# Patient Record
Sex: Male | Born: 1940
Health system: Southern US, Community
[De-identification: ages and names within clinical notes are randomized; demographics above are authoritative.]

## PROBLEM LIST (undated history)

## (undated) DIAGNOSIS — E119 Type 2 diabetes mellitus without complications: Secondary | ICD-10-CM

## (undated) DIAGNOSIS — I480 Paroxysmal atrial fibrillation: Secondary | ICD-10-CM

## (undated) DIAGNOSIS — I10 Essential (primary) hypertension: Secondary | ICD-10-CM

## (undated) DIAGNOSIS — E785 Hyperlipidemia, unspecified: Secondary | ICD-10-CM

## (undated) HISTORY — PX: BACK SURGERY: SHX140

## (undated) HISTORY — PX: RETINAL DETACHMENT SURGERY: SHX105

## (undated) HISTORY — PX: CATARACT EXTRACTION: SUR2

## (undated) HISTORY — DX: Morbid (severe) obesity due to excess calories: E66.01

## (undated) HISTORY — PX: CHOLECYSTECTOMY OPEN: SUR202

## (undated) HISTORY — PX: CYST REMOVAL TRUNK: SHX6283

## (undated) HISTORY — PX: OTHER SURGICAL HISTORY: SHX169

## (undated) HISTORY — DX: Type 2 diabetes mellitus without complications: E11.9

## (undated) HISTORY — DX: Essential (primary) hypertension: I10

## (undated) HISTORY — DX: Hyperlipidemia, unspecified: E78.5

## (undated) HISTORY — DX: Paroxysmal atrial fibrillation: I48.0

---

## 1999-01-29 ENCOUNTER — Encounter: Payer: Self-pay | Admitting: Ophthalmology

## 1999-01-29 ENCOUNTER — Ambulatory Visit (HOSPITAL_COMMUNITY): Admission: RE | Admit: 1999-01-29 | Discharge: 1999-01-30 | Payer: Self-pay | Admitting: Ophthalmology

## 1999-01-30 ENCOUNTER — Ambulatory Visit (HOSPITAL_COMMUNITY): Admission: RE | Admit: 1999-01-30 | Discharge: 1999-01-30 | Payer: Self-pay | Admitting: Ophthalmology

## 2003-12-26 ENCOUNTER — Ambulatory Visit (HOSPITAL_COMMUNITY): Admission: RE | Admit: 2003-12-26 | Discharge: 2003-12-27 | Payer: Self-pay | Admitting: Neurosurgery

## 2005-10-14 IMAGING — CR DG CHEST 2V
2 series · 2 of 2 positions shown · non-contrast
Comparison: none

CLINICAL DATA: Preop for surgery on lumbar spine for spondylosis.
 CHEST TWO VIEWS:
 Two views of the chest show the lungs to be clear but slightly hyperaerated.  The heart is within the upper limits of normal.  No acute bony abnormality is seen.

[view not recorded (1 of 2)]
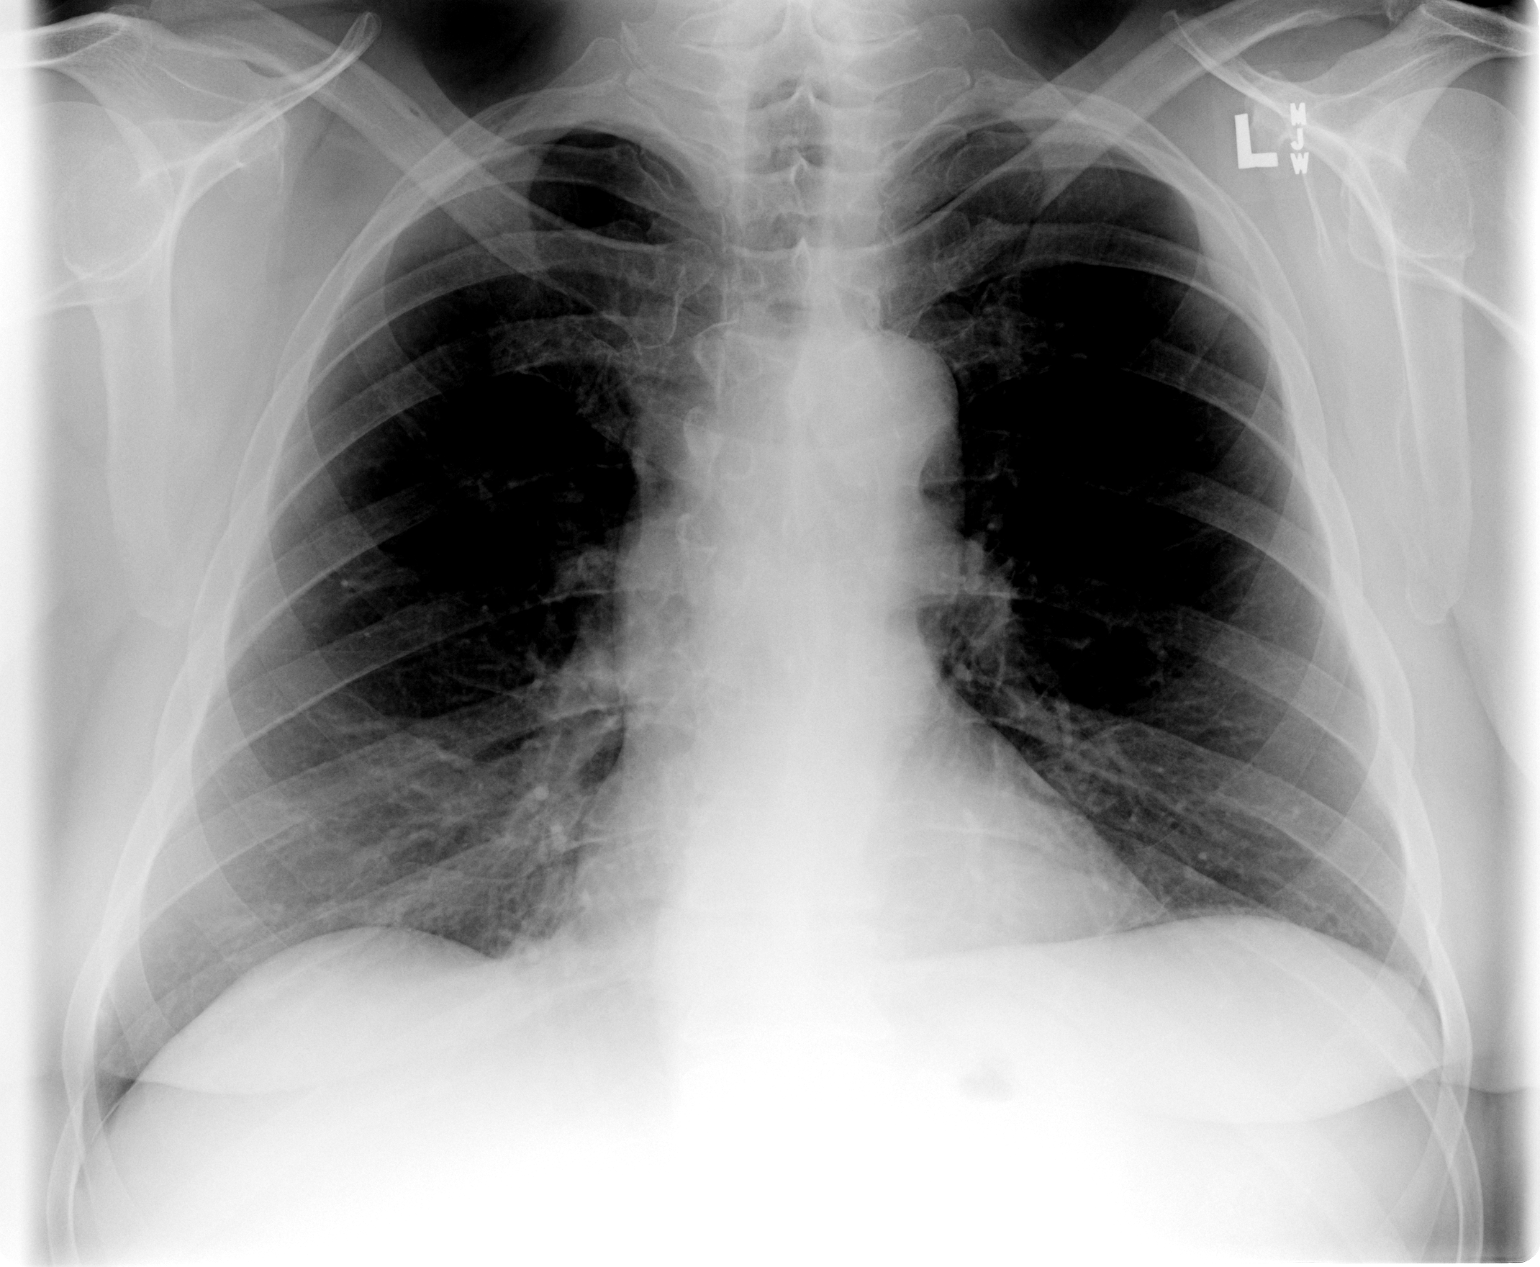

[view not recorded (2 of 2)]
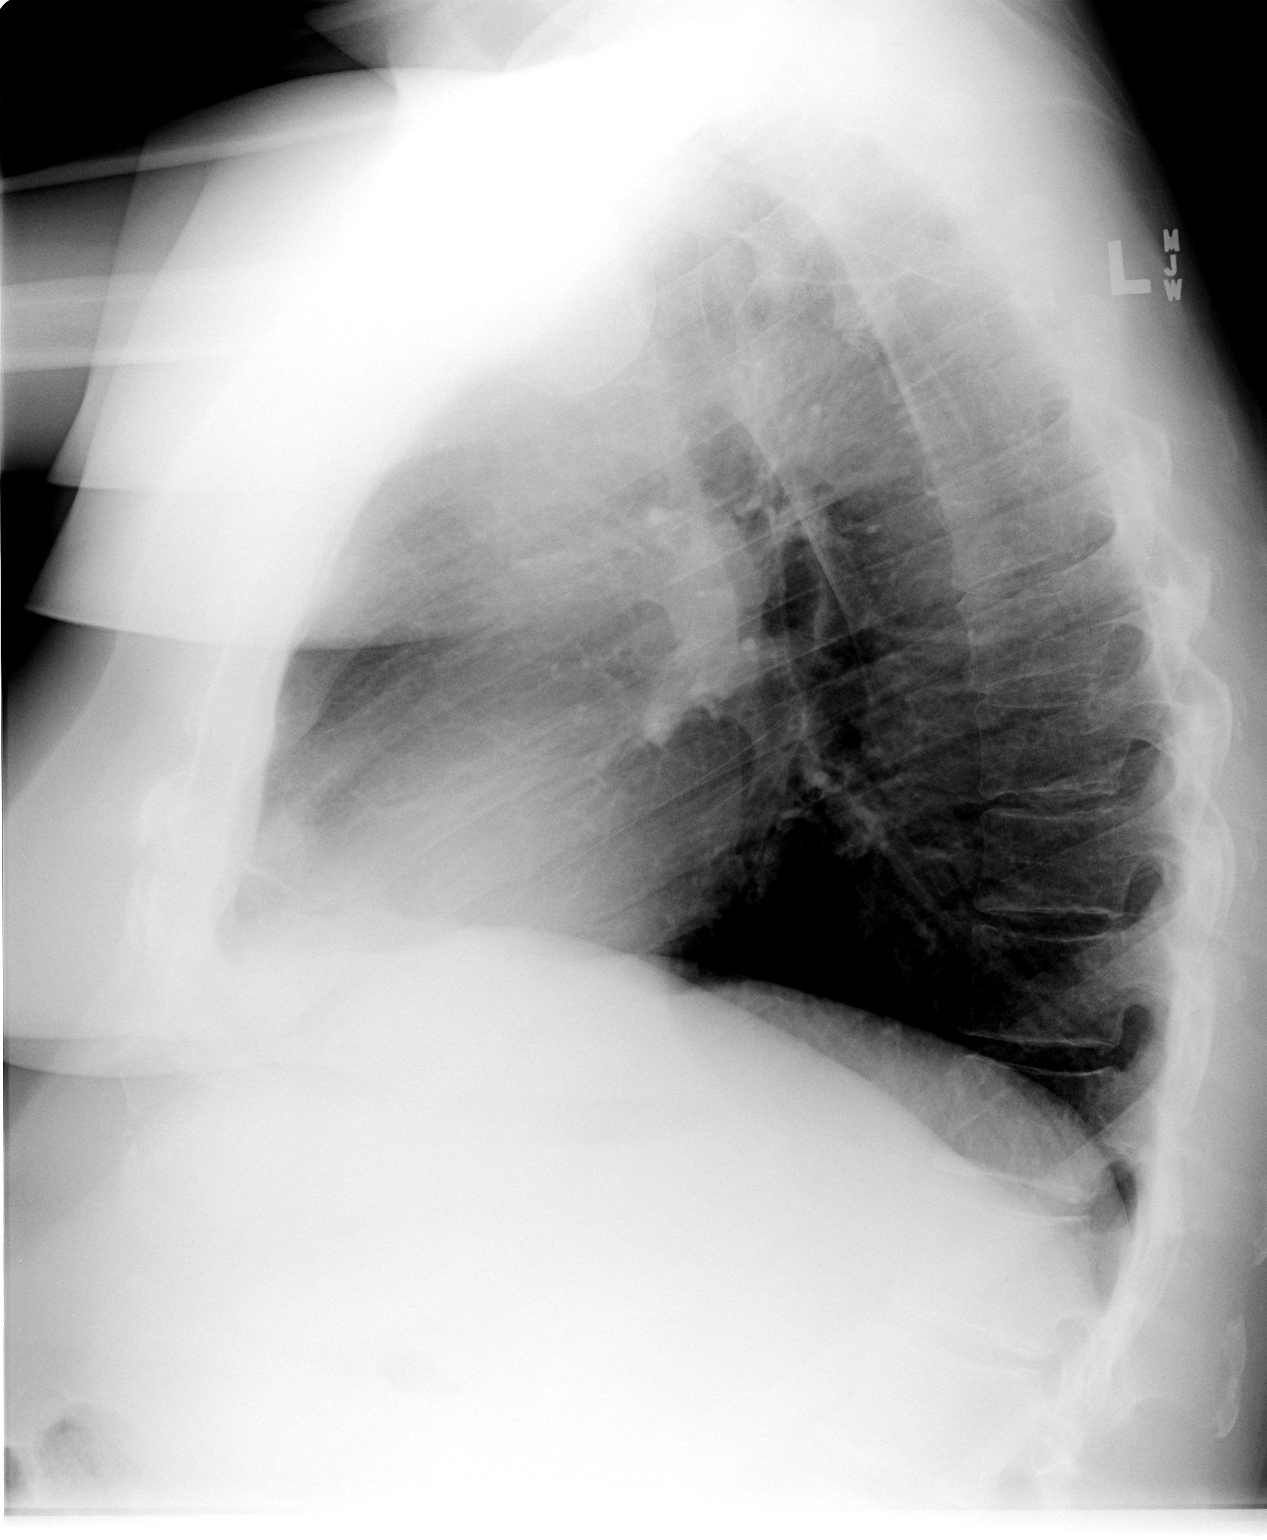

[2 of 2 positions shown; findings below may reference images not displayed]

IMPRESSION: No active lung disease.

## 2015-04-03 DIAGNOSIS — K219 Gastro-esophageal reflux disease without esophagitis: Secondary | ICD-10-CM | POA: Diagnosis not present

## 2015-04-03 DIAGNOSIS — K2270 Barrett's esophagus without dysplasia: Secondary | ICD-10-CM | POA: Diagnosis not present

## 2015-07-03 DIAGNOSIS — G8929 Other chronic pain: Secondary | ICD-10-CM | POA: Diagnosis not present

## 2015-07-03 DIAGNOSIS — I10 Essential (primary) hypertension: Secondary | ICD-10-CM | POA: Diagnosis not present

## 2015-07-03 DIAGNOSIS — M25562 Pain in left knee: Secondary | ICD-10-CM | POA: Diagnosis not present

## 2015-08-01 DIAGNOSIS — E119 Type 2 diabetes mellitus without complications: Secondary | ICD-10-CM | POA: Diagnosis not present

## 2015-08-01 DIAGNOSIS — I10 Essential (primary) hypertension: Secondary | ICD-10-CM | POA: Diagnosis not present

## 2015-08-01 DIAGNOSIS — R5383 Other fatigue: Secondary | ICD-10-CM | POA: Diagnosis not present

## 2015-08-01 DIAGNOSIS — E785 Hyperlipidemia, unspecified: Secondary | ICD-10-CM | POA: Diagnosis not present

## 2015-08-01 DIAGNOSIS — Z79899 Other long term (current) drug therapy: Secondary | ICD-10-CM | POA: Diagnosis not present

## 2015-08-01 DIAGNOSIS — E669 Obesity, unspecified: Secondary | ICD-10-CM | POA: Diagnosis not present

## 2015-11-16 DIAGNOSIS — Z23 Encounter for immunization: Secondary | ICD-10-CM | POA: Diagnosis not present

## 2015-11-21 DIAGNOSIS — I10 Essential (primary) hypertension: Secondary | ICD-10-CM | POA: Diagnosis not present

## 2015-11-21 DIAGNOSIS — Z9181 History of falling: Secondary | ICD-10-CM | POA: Diagnosis not present

## 2015-11-21 DIAGNOSIS — D509 Iron deficiency anemia, unspecified: Secondary | ICD-10-CM | POA: Diagnosis not present

## 2015-11-21 DIAGNOSIS — Z0000 Encounter for general adult medical examination without abnormal findings: Secondary | ICD-10-CM | POA: Diagnosis not present

## 2015-11-21 DIAGNOSIS — Z1389 Encounter for screening for other disorder: Secondary | ICD-10-CM | POA: Diagnosis not present

## 2015-11-21 DIAGNOSIS — Z79899 Other long term (current) drug therapy: Secondary | ICD-10-CM | POA: Diagnosis not present

## 2015-11-21 DIAGNOSIS — E669 Obesity, unspecified: Secondary | ICD-10-CM | POA: Diagnosis not present

## 2015-11-21 DIAGNOSIS — E785 Hyperlipidemia, unspecified: Secondary | ICD-10-CM | POA: Diagnosis not present

## 2015-11-21 DIAGNOSIS — E119 Type 2 diabetes mellitus without complications: Secondary | ICD-10-CM | POA: Diagnosis not present

## 2015-12-06 DIAGNOSIS — J329 Chronic sinusitis, unspecified: Secondary | ICD-10-CM | POA: Diagnosis not present

## 2015-12-06 DIAGNOSIS — J40 Bronchitis, not specified as acute or chronic: Secondary | ICD-10-CM | POA: Diagnosis not present

## 2016-04-08 DIAGNOSIS — K219 Gastro-esophageal reflux disease without esophagitis: Secondary | ICD-10-CM | POA: Diagnosis not present

## 2016-04-08 DIAGNOSIS — K2270 Barrett's esophagus without dysplasia: Secondary | ICD-10-CM | POA: Diagnosis not present

## 2016-06-17 DIAGNOSIS — L578 Other skin changes due to chronic exposure to nonionizing radiation: Secondary | ICD-10-CM | POA: Diagnosis not present

## 2016-06-17 DIAGNOSIS — L821 Other seborrheic keratosis: Secondary | ICD-10-CM | POA: Diagnosis not present

## 2016-06-17 DIAGNOSIS — L219 Seborrheic dermatitis, unspecified: Secondary | ICD-10-CM | POA: Diagnosis not present

## 2016-06-17 DIAGNOSIS — C44311 Basal cell carcinoma of skin of nose: Secondary | ICD-10-CM | POA: Diagnosis not present

## 2016-07-25 DIAGNOSIS — E1165 Type 2 diabetes mellitus with hyperglycemia: Secondary | ICD-10-CM | POA: Diagnosis not present

## 2016-07-25 DIAGNOSIS — Z6834 Body mass index (BMI) 34.0-34.9, adult: Secondary | ICD-10-CM | POA: Diagnosis not present

## 2016-07-25 DIAGNOSIS — I10 Essential (primary) hypertension: Secondary | ICD-10-CM | POA: Diagnosis not present

## 2016-07-25 DIAGNOSIS — Z7984 Long term (current) use of oral hypoglycemic drugs: Secondary | ICD-10-CM | POA: Diagnosis not present

## 2016-07-25 DIAGNOSIS — E785 Hyperlipidemia, unspecified: Secondary | ICD-10-CM | POA: Diagnosis not present

## 2016-07-25 DIAGNOSIS — Z79899 Other long term (current) drug therapy: Secondary | ICD-10-CM | POA: Diagnosis not present

## 2016-07-25 DIAGNOSIS — E669 Obesity, unspecified: Secondary | ICD-10-CM | POA: Diagnosis not present

## 2016-08-28 DIAGNOSIS — G8929 Other chronic pain: Secondary | ICD-10-CM | POA: Diagnosis not present

## 2016-08-28 DIAGNOSIS — Z6834 Body mass index (BMI) 34.0-34.9, adult: Secondary | ICD-10-CM | POA: Diagnosis not present

## 2016-08-28 DIAGNOSIS — E1165 Type 2 diabetes mellitus with hyperglycemia: Secondary | ICD-10-CM | POA: Diagnosis not present

## 2016-08-28 DIAGNOSIS — M25562 Pain in left knee: Secondary | ICD-10-CM | POA: Diagnosis not present

## 2016-09-06 DIAGNOSIS — Z6833 Body mass index (BMI) 33.0-33.9, adult: Secondary | ICD-10-CM | POA: Diagnosis not present

## 2016-09-06 DIAGNOSIS — S335XXA Sprain of ligaments of lumbar spine, initial encounter: Secondary | ICD-10-CM | POA: Diagnosis not present

## 2016-11-22 DIAGNOSIS — Z23 Encounter for immunization: Secondary | ICD-10-CM | POA: Diagnosis not present

## 2017-01-22 DIAGNOSIS — Z6833 Body mass index (BMI) 33.0-33.9, adult: Secondary | ICD-10-CM | POA: Diagnosis not present

## 2017-01-22 DIAGNOSIS — J329 Chronic sinusitis, unspecified: Secondary | ICD-10-CM | POA: Diagnosis not present

## 2017-04-17 DIAGNOSIS — Z7984 Long term (current) use of oral hypoglycemic drugs: Secondary | ICD-10-CM | POA: Diagnosis not present

## 2017-04-17 DIAGNOSIS — Z1331 Encounter for screening for depression: Secondary | ICD-10-CM | POA: Diagnosis not present

## 2017-04-17 DIAGNOSIS — E669 Obesity, unspecified: Secondary | ICD-10-CM | POA: Diagnosis not present

## 2017-04-17 DIAGNOSIS — Z6834 Body mass index (BMI) 34.0-34.9, adult: Secondary | ICD-10-CM | POA: Diagnosis not present

## 2017-04-17 DIAGNOSIS — M25562 Pain in left knee: Secondary | ICD-10-CM | POA: Diagnosis not present

## 2017-04-17 DIAGNOSIS — M1712 Unilateral primary osteoarthritis, left knee: Secondary | ICD-10-CM | POA: Diagnosis not present

## 2017-04-17 DIAGNOSIS — G8929 Other chronic pain: Secondary | ICD-10-CM | POA: Diagnosis not present

## 2017-04-17 DIAGNOSIS — Z9181 History of falling: Secondary | ICD-10-CM | POA: Diagnosis not present

## 2017-04-17 DIAGNOSIS — E1165 Type 2 diabetes mellitus with hyperglycemia: Secondary | ICD-10-CM | POA: Diagnosis not present

## 2017-06-27 DIAGNOSIS — Z6833 Body mass index (BMI) 33.0-33.9, adult: Secondary | ICD-10-CM | POA: Diagnosis not present

## 2017-06-27 DIAGNOSIS — J329 Chronic sinusitis, unspecified: Secondary | ICD-10-CM | POA: Diagnosis not present

## 2017-07-28 DIAGNOSIS — K2270 Barrett's esophagus without dysplasia: Secondary | ICD-10-CM | POA: Diagnosis not present

## 2017-07-28 DIAGNOSIS — K219 Gastro-esophageal reflux disease without esophagitis: Secondary | ICD-10-CM | POA: Diagnosis not present

## 2017-08-07 DIAGNOSIS — Z79899 Other long term (current) drug therapy: Secondary | ICD-10-CM | POA: Diagnosis not present

## 2017-08-07 DIAGNOSIS — K2270 Barrett's esophagus without dysplasia: Secondary | ICD-10-CM | POA: Diagnosis not present

## 2017-08-07 DIAGNOSIS — K449 Diaphragmatic hernia without obstruction or gangrene: Secondary | ICD-10-CM | POA: Diagnosis not present

## 2017-08-07 DIAGNOSIS — K319 Disease of stomach and duodenum, unspecified: Secondary | ICD-10-CM | POA: Diagnosis not present

## 2017-08-07 DIAGNOSIS — K229 Disease of esophagus, unspecified: Secondary | ICD-10-CM | POA: Diagnosis not present

## 2017-08-07 DIAGNOSIS — K210 Gastro-esophageal reflux disease with esophagitis: Secondary | ICD-10-CM | POA: Diagnosis not present

## 2017-08-07 DIAGNOSIS — K2950 Unspecified chronic gastritis without bleeding: Secondary | ICD-10-CM | POA: Diagnosis not present

## 2017-08-07 DIAGNOSIS — Z7984 Long term (current) use of oral hypoglycemic drugs: Secondary | ICD-10-CM | POA: Diagnosis not present

## 2017-08-07 DIAGNOSIS — M1990 Unspecified osteoarthritis, unspecified site: Secondary | ICD-10-CM | POA: Diagnosis not present

## 2017-08-07 DIAGNOSIS — Z7982 Long term (current) use of aspirin: Secondary | ICD-10-CM | POA: Diagnosis not present

## 2017-08-07 DIAGNOSIS — I10 Essential (primary) hypertension: Secondary | ICD-10-CM | POA: Diagnosis not present

## 2017-08-07 DIAGNOSIS — K219 Gastro-esophageal reflux disease without esophagitis: Secondary | ICD-10-CM | POA: Diagnosis not present

## 2017-08-07 DIAGNOSIS — E119 Type 2 diabetes mellitus without complications: Secondary | ICD-10-CM | POA: Diagnosis not present

## 2017-08-07 DIAGNOSIS — E7800 Pure hypercholesterolemia, unspecified: Secondary | ICD-10-CM | POA: Diagnosis not present

## 2017-08-07 DIAGNOSIS — Z8719 Personal history of other diseases of the digestive system: Secondary | ICD-10-CM | POA: Diagnosis not present

## 2017-09-17 DIAGNOSIS — I10 Essential (primary) hypertension: Secondary | ICD-10-CM | POA: Diagnosis not present

## 2017-09-17 DIAGNOSIS — E785 Hyperlipidemia, unspecified: Secondary | ICD-10-CM | POA: Diagnosis not present

## 2017-10-29 DIAGNOSIS — Z23 Encounter for immunization: Secondary | ICD-10-CM | POA: Diagnosis not present

## 2017-11-14 DIAGNOSIS — E669 Obesity, unspecified: Secondary | ICD-10-CM | POA: Diagnosis not present

## 2017-11-14 DIAGNOSIS — M1712 Unilateral primary osteoarthritis, left knee: Secondary | ICD-10-CM | POA: Diagnosis not present

## 2017-11-14 DIAGNOSIS — E1165 Type 2 diabetes mellitus with hyperglycemia: Secondary | ICD-10-CM | POA: Diagnosis not present

## 2017-11-14 DIAGNOSIS — Z1339 Encounter for screening examination for other mental health and behavioral disorders: Secondary | ICD-10-CM | POA: Diagnosis not present

## 2017-11-14 DIAGNOSIS — I10 Essential (primary) hypertension: Secondary | ICD-10-CM | POA: Diagnosis not present

## 2017-11-14 DIAGNOSIS — G8929 Other chronic pain: Secondary | ICD-10-CM | POA: Diagnosis not present

## 2017-11-14 DIAGNOSIS — M62838 Other muscle spasm: Secondary | ICD-10-CM | POA: Diagnosis not present

## 2017-11-14 DIAGNOSIS — Z6834 Body mass index (BMI) 34.0-34.9, adult: Secondary | ICD-10-CM | POA: Diagnosis not present

## 2017-11-14 DIAGNOSIS — M25562 Pain in left knee: Secondary | ICD-10-CM | POA: Diagnosis not present

## 2018-05-20 DIAGNOSIS — F411 Generalized anxiety disorder: Secondary | ICD-10-CM | POA: Diagnosis not present

## 2018-05-20 DIAGNOSIS — M1712 Unilateral primary osteoarthritis, left knee: Secondary | ICD-10-CM | POA: Diagnosis not present

## 2018-05-20 DIAGNOSIS — H353131 Nonexudative age-related macular degeneration, bilateral, early dry stage: Secondary | ICD-10-CM | POA: Diagnosis not present

## 2018-05-20 DIAGNOSIS — M25562 Pain in left knee: Secondary | ICD-10-CM | POA: Diagnosis not present

## 2018-05-20 DIAGNOSIS — E119 Type 2 diabetes mellitus without complications: Secondary | ICD-10-CM | POA: Diagnosis not present

## 2018-05-20 DIAGNOSIS — G8929 Other chronic pain: Secondary | ICD-10-CM | POA: Diagnosis not present

## 2018-05-20 DIAGNOSIS — H02831 Dermatochalasis of right upper eyelid: Secondary | ICD-10-CM | POA: Diagnosis not present

## 2018-05-20 DIAGNOSIS — Z961 Presence of intraocular lens: Secondary | ICD-10-CM | POA: Diagnosis not present

## 2018-05-20 DIAGNOSIS — H02834 Dermatochalasis of left upper eyelid: Secondary | ICD-10-CM | POA: Diagnosis not present

## 2018-05-20 DIAGNOSIS — E669 Obesity, unspecified: Secondary | ICD-10-CM | POA: Diagnosis not present

## 2018-05-20 DIAGNOSIS — Z6833 Body mass index (BMI) 33.0-33.9, adult: Secondary | ICD-10-CM | POA: Diagnosis not present

## 2018-05-20 DIAGNOSIS — H52203 Unspecified astigmatism, bilateral: Secondary | ICD-10-CM | POA: Diagnosis not present

## 2018-05-20 DIAGNOSIS — E1165 Type 2 diabetes mellitus with hyperglycemia: Secondary | ICD-10-CM | POA: Diagnosis not present

## 2018-05-20 DIAGNOSIS — Z7984 Long term (current) use of oral hypoglycemic drugs: Secondary | ICD-10-CM | POA: Diagnosis not present

## 2018-05-20 DIAGNOSIS — H5213 Myopia, bilateral: Secondary | ICD-10-CM | POA: Diagnosis not present

## 2018-05-20 DIAGNOSIS — H524 Presbyopia: Secondary | ICD-10-CM | POA: Diagnosis not present

## 2018-08-28 DIAGNOSIS — K219 Gastro-esophageal reflux disease without esophagitis: Secondary | ICD-10-CM | POA: Diagnosis not present

## 2018-08-28 DIAGNOSIS — E669 Obesity, unspecified: Secondary | ICD-10-CM | POA: Diagnosis not present

## 2018-08-28 DIAGNOSIS — Z0000 Encounter for general adult medical examination without abnormal findings: Secondary | ICD-10-CM | POA: Diagnosis not present

## 2018-08-28 DIAGNOSIS — J309 Allergic rhinitis, unspecified: Secondary | ICD-10-CM | POA: Diagnosis not present

## 2018-08-28 DIAGNOSIS — Z79899 Other long term (current) drug therapy: Secondary | ICD-10-CM | POA: Diagnosis not present

## 2018-08-28 DIAGNOSIS — N401 Enlarged prostate with lower urinary tract symptoms: Secondary | ICD-10-CM | POA: Diagnosis not present

## 2018-08-28 DIAGNOSIS — Z9181 History of falling: Secondary | ICD-10-CM | POA: Diagnosis not present

## 2018-08-28 DIAGNOSIS — I10 Essential (primary) hypertension: Secondary | ICD-10-CM | POA: Diagnosis not present

## 2018-08-28 DIAGNOSIS — Z6833 Body mass index (BMI) 33.0-33.9, adult: Secondary | ICD-10-CM | POA: Diagnosis not present

## 2018-08-28 DIAGNOSIS — E785 Hyperlipidemia, unspecified: Secondary | ICD-10-CM | POA: Diagnosis not present

## 2018-08-28 DIAGNOSIS — N138 Other obstructive and reflux uropathy: Secondary | ICD-10-CM | POA: Diagnosis not present

## 2018-08-28 DIAGNOSIS — E1165 Type 2 diabetes mellitus with hyperglycemia: Secondary | ICD-10-CM | POA: Diagnosis not present

## 2018-09-03 DIAGNOSIS — K209 Esophagitis, unspecified: Secondary | ICD-10-CM | POA: Diagnosis not present

## 2018-09-03 DIAGNOSIS — Z6834 Body mass index (BMI) 34.0-34.9, adult: Secondary | ICD-10-CM | POA: Diagnosis not present

## 2018-09-03 DIAGNOSIS — K2270 Barrett's esophagus without dysplasia: Secondary | ICD-10-CM | POA: Diagnosis not present

## 2018-09-03 DIAGNOSIS — K219 Gastro-esophageal reflux disease without esophagitis: Secondary | ICD-10-CM | POA: Diagnosis not present

## 2018-09-03 DIAGNOSIS — R110 Nausea: Secondary | ICD-10-CM | POA: Diagnosis not present

## 2018-09-21 ENCOUNTER — Other Ambulatory Visit: Payer: Self-pay

## 2018-10-31 DIAGNOSIS — Z23 Encounter for immunization: Secondary | ICD-10-CM | POA: Diagnosis not present

## 2019-01-06 DIAGNOSIS — E669 Obesity, unspecified: Secondary | ICD-10-CM | POA: Diagnosis not present

## 2019-01-06 DIAGNOSIS — M25562 Pain in left knee: Secondary | ICD-10-CM | POA: Diagnosis not present

## 2019-01-06 DIAGNOSIS — Z1331 Encounter for screening for depression: Secondary | ICD-10-CM | POA: Diagnosis not present

## 2019-01-06 DIAGNOSIS — M1712 Unilateral primary osteoarthritis, left knee: Secondary | ICD-10-CM | POA: Diagnosis not present

## 2019-01-06 DIAGNOSIS — G8929 Other chronic pain: Secondary | ICD-10-CM | POA: Diagnosis not present

## 2019-01-06 DIAGNOSIS — Z6834 Body mass index (BMI) 34.0-34.9, adult: Secondary | ICD-10-CM | POA: Diagnosis not present

## 2019-01-06 DIAGNOSIS — E1165 Type 2 diabetes mellitus with hyperglycemia: Secondary | ICD-10-CM | POA: Diagnosis not present

## 2019-03-08 DIAGNOSIS — Z20828 Contact with and (suspected) exposure to other viral communicable diseases: Secondary | ICD-10-CM | POA: Diagnosis not present

## 2019-06-14 DIAGNOSIS — L02212 Cutaneous abscess of back [any part, except buttock]: Secondary | ICD-10-CM | POA: Diagnosis not present

## 2019-06-14 DIAGNOSIS — L0390 Cellulitis, unspecified: Secondary | ICD-10-CM | POA: Diagnosis not present

## 2019-07-23 DIAGNOSIS — Z79899 Other long term (current) drug therapy: Secondary | ICD-10-CM | POA: Diagnosis not present

## 2019-07-23 DIAGNOSIS — E1165 Type 2 diabetes mellitus with hyperglycemia: Secondary | ICD-10-CM | POA: Diagnosis not present

## 2019-07-23 DIAGNOSIS — M5136 Other intervertebral disc degeneration, lumbar region: Secondary | ICD-10-CM | POA: Diagnosis not present

## 2019-07-23 DIAGNOSIS — K209 Esophagitis, unspecified without bleeding: Secondary | ICD-10-CM | POA: Diagnosis not present

## 2019-07-23 DIAGNOSIS — E1169 Type 2 diabetes mellitus with other specified complication: Secondary | ICD-10-CM | POA: Diagnosis not present

## 2019-07-23 DIAGNOSIS — R110 Nausea: Secondary | ICD-10-CM | POA: Diagnosis not present

## 2019-07-23 DIAGNOSIS — M5416 Radiculopathy, lumbar region: Secondary | ICD-10-CM | POA: Diagnosis not present

## 2019-07-23 DIAGNOSIS — K219 Gastro-esophageal reflux disease without esophagitis: Secondary | ICD-10-CM | POA: Diagnosis not present

## 2019-07-23 DIAGNOSIS — R29898 Other symptoms and signs involving the musculoskeletal system: Secondary | ICD-10-CM | POA: Diagnosis not present

## 2019-07-23 DIAGNOSIS — K2270 Barrett's esophagus without dysplasia: Secondary | ICD-10-CM | POA: Diagnosis not present

## 2019-07-23 DIAGNOSIS — E785 Hyperlipidemia, unspecified: Secondary | ICD-10-CM | POA: Diagnosis not present

## 2019-07-23 DIAGNOSIS — E7800 Pure hypercholesterolemia, unspecified: Secondary | ICD-10-CM | POA: Diagnosis not present

## 2019-07-26 DIAGNOSIS — E871 Hypo-osmolality and hyponatremia: Secondary | ICD-10-CM | POA: Diagnosis not present

## 2019-07-26 DIAGNOSIS — D649 Anemia, unspecified: Secondary | ICD-10-CM | POA: Diagnosis not present

## 2019-07-26 DIAGNOSIS — R0600 Dyspnea, unspecified: Secondary | ICD-10-CM | POA: Diagnosis not present

## 2019-07-26 DIAGNOSIS — Z6835 Body mass index (BMI) 35.0-35.9, adult: Secondary | ICD-10-CM | POA: Diagnosis not present

## 2019-07-26 DIAGNOSIS — E1169 Type 2 diabetes mellitus with other specified complication: Secondary | ICD-10-CM | POA: Diagnosis not present

## 2019-07-26 DIAGNOSIS — I4891 Unspecified atrial fibrillation: Secondary | ICD-10-CM | POA: Diagnosis not present

## 2019-07-27 ENCOUNTER — Other Ambulatory Visit: Payer: Self-pay

## 2019-07-27 ENCOUNTER — Encounter: Payer: Self-pay | Admitting: Cardiology

## 2019-07-27 ENCOUNTER — Telehealth: Payer: Self-pay | Admitting: *Deleted

## 2019-07-27 ENCOUNTER — Ambulatory Visit: Payer: PPO | Admitting: Cardiology

## 2019-07-27 VITALS — BP 130/58 | HR 67 | Ht 75.0 in | Wt 278.4 lb

## 2019-07-27 DIAGNOSIS — E871 Hypo-osmolality and hyponatremia: Secondary | ICD-10-CM | POA: Diagnosis not present

## 2019-07-27 DIAGNOSIS — R0602 Shortness of breath: Secondary | ICD-10-CM | POA: Diagnosis not present

## 2019-07-27 DIAGNOSIS — I480 Paroxysmal atrial fibrillation: Secondary | ICD-10-CM | POA: Diagnosis not present

## 2019-07-27 DIAGNOSIS — I10 Essential (primary) hypertension: Secondary | ICD-10-CM | POA: Diagnosis not present

## 2019-07-27 DIAGNOSIS — E785 Hyperlipidemia, unspecified: Secondary | ICD-10-CM | POA: Insufficient documentation

## 2019-07-27 MED ORDER — FUROSEMIDE 40 MG PO TABS
40.0000 mg | ORAL_TABLET | Freq: Every day | ORAL | 2 refills | Status: DC
Start: 2019-07-27 — End: 2019-08-26

## 2019-07-27 MED ORDER — APIXABAN 5 MG PO TABS: 5.0000 mg | ORAL_TABLET | Freq: Two times a day (BID) | ORAL | 1 refills | Status: AC

## 2019-07-27 NOTE — Telephone Encounter (Signed)
S/w Amber in medical records at [facility] [phone] to have faxed EKG from office visit yesterday.

## 2019-07-27 NOTE — Progress Notes (Addendum)
Cardiology Office Note:    Date:  07/28/2019   ID:  DAVAUGHN HILLYARD, DOB 09-06-1940, MRN [id number]  PCP:  Street, Sharon Mt, MD  Cardiologist:  Berniece Salines, DO  Electrophysiologist:  None   Referring MD: No ref. provider found   " I have so tired"  History of Present Illness:    Benjamin Boyd is a 79 y.o. male with a hx of diabetes type 2, dyslipidemia who presents today to be evaluated for new onset atrial fibrillation.  The patient was asked by his primary care doctor to be seen by cardiology.  He was seen yesterday as a primary care doctor as he complained of worsening shortness of breath and fatigue.  During his visit he was noted to be in atrial fibrillation with controlled ventricular rate.  He is here today with his wife he denies chest pain.  Past Medical History:  Diagnosis Date  . Diabetes type 2, controlled ([place])   . Hyperlipidemia   . Hypertension   . Morbid obesity ([place])   . Paroxysmal atrial fibrillation [facility])     Past Surgical History:  Procedure Laterality Date  . BACK SURGERY    . basil cell    . CATARACT EXTRACTION    . CHOLECYSTECTOMY OPEN    . CYST REMOVAL TRUNK    . RETINAL DETACHMENT SURGERY      Current Medications: Current Meds  Medication Sig  . amLODipine (NORVASC) 10 MG tablet Take 10 mg by mouth daily.  Marland Kitchen azelastine (ASTELIN) 0.1 % nasal spray Place into both nostrils 2 (two) times daily. Use in each nostril as directed  . gabapentin (NEURONTIN) 300 MG capsule Take 300 mg by mouth daily.  . hydrALAZINE (APRESOLINE) 100 MG tablet Take 100 mg by mouth daily.  . hydrOXYzine (ATARAX/VISTARIL) 10 MG tablet Take 10 mg by mouth daily.  Marland Kitchen lisinopril (ZESTRIL) 40 MG tablet Take 40 mg by mouth daily.  Marland Kitchen loratadine (CLARITIN) 10 MG tablet Take 10 mg by mouth daily.  . metFORMIN (GLUCOPHAGE-XR) 500 MG 24 hr tablet Take 500 mg by mouth daily with breakfast.  . metoprolol tartrate (LOPRESSOR) 50 MG tablet Take 50 mg by mouth 2 (two) times daily.    . pantoprazole (PROTONIX) 40 MG tablet Take 40 mg by mouth daily.  . pravastatin (PRAVACHOL) 40 MG tablet Take 40 mg by mouth daily.  Marland Kitchen spironolactone (ALDACTONE) 25 MG tablet Take 25 mg by mouth daily.  . tamsulosin (FLOMAX) 0.4 MG CAPS capsule Take 0.4 mg by mouth daily.  . [DISCONTINUED] aspirin EC 81 MG tablet Take 81 mg by mouth daily.     Allergies:   Patient has no known allergies.   Social History   Socioeconomic History  . Marital status: Married    Spouse name: Not on file  . Number of children: Not on file  . Years of education: Not on file  . Highest education level: Not on file  Occupational History  . Not on file  Tobacco Use  . Smoking status: Former Research scientist (life sciences)  . Smokeless tobacco: Never Used  Substance and Sexual Activity  . Alcohol use: Never  . Drug use: Never  . Sexual activity: Not on file  Other Topics Concern  . Not on file  Social History Narrative  . Not on file   Social Determinants of Health   Financial Resource Strain:   . Difficulty of Paying Living Expenses:   Food Insecurity:   . Worried About Charity fundraiser in  the Last Year:   . [place] in the Last Year:   Transportation Needs:   . Film/video editor (Medical):   Marland Kitchen Lack of Transportation (Non-Medical):   Physical Activity:   . Days of Exercise per Week:   . Minutes of Exercise per Session:   Stress:   . Feeling of Stress :   Social Connections:   . Frequency of Communication with Friends and Family:   . Frequency of Social Gatherings with Friends and Family:   . Attends Religious Services:   . Active Member of Clubs or Organizations:   . Attends Archivist Meetings:   Marland Kitchen Marital Status:      Family History: The patient's family history includes Diabetes in his brother and sister; High blood pressure in his mother and sister.  ROS:   Review of Systems  Constitution: Reports fatigue.  Negative for decreased appetite, fever and weight gain.  HENT:  Negative for congestion, ear discharge, hoarse voice and sore throat.   Eyes: Negative for discharge, redness, vision loss in right eye and visual halos.  Cardiovascular: Reports shortness of breath on exertion and leg swelling..  Negative for chest pain, adenopathy 1 week and have it orthopnea and palpitations.  Respiratory: Negative for cough, hemoptysis, shortness of breath and snoring.   Endocrine: Negative for heat intolerance and polyphagia.  Hematologic/Lymphatic: Negative for bleeding problem. Does not bruise/bleed easily.  Skin: Negative for flushing, nail changes, rash and suspicious lesions.  Musculoskeletal: Negative for arthritis, joint pain, muscle cramps, myalgias, neck pain and stiffness.  Gastrointestinal: Negative for abdominal pain, bowel incontinence, diarrhea and excessive appetite.  Genitourinary: Negative for decreased libido, genital sores and incomplete emptying.  Neurological: Negative for brief paralysis, focal weakness, headaches and loss of balance.  Psychiatric/Behavioral: Negative for altered mental status, depression and suicidal ideas.  Allergic/Immunologic: Negative for HIV exposure and persistent infections.    EKGs/Labs/Other Studies Reviewed:    The following studies were reviewed today:   EKG:  The ekg ordered today demonstrates sinus rhythm, with transitional beats to atrial fibrillation.  Compared to his EKG done yesterday which showed atrial fibrillation with controlled ventricular rate.  Recent Labs: No results found for requested labs within last 8760 hours.  Recent Lipid Panel No results found for: CHOL, TRIG, HDL, CHOLHDL, VLDL, LDLCALC, LDLDIRECT  Physical Exam:    VS:  BP (!) 130/58 (BP Location: Right Arm, Patient Position: Sitting, Cuff Size: Normal)   Pulse 67   Ht '6\' 3"'  (1.905 m)   Wt 278 lb 6.4 oz (126.3 kg)   SpO2 95% Comment: a rest  BMI 34.80 kg/m     Wt Readings from Last 3 Encounters:  07/27/19 278 lb 6.4 oz (126.3 kg)       GEN: Well nourished, well developed in no acute distress HEENT: Normal NECK: No JVD; No carotid bruits LYMPHATICS: No lymphadenopathy CARDIAC: S1S2 noted,RRR, no murmurs, rubs, gallops RESPIRATORY:  Clear to auscultation without rales, wheezing or rhonchi  ABDOMEN: Soft, non-tender, non-distended, +bowel sounds, no guarding. EXTREMITIES: +2 bilateral leg edema, No cyanosis, no clubbing MUSCULOSKELETAL:  No deformity  SKIN: Warm and dry NEUROLOGIC:  Alert and oriented x 3, non-focal PSYCHIATRIC:  Normal affect, good insight  ASSESSMENT:    1. Shortness of breath   2. Hyperlipidemia, unspecified hyperlipidemia type   3. PAF (paroxysmal atrial fibrillation) ([place])   4. Morbid obesity ([place])   5. Essential hypertension   6. Hyponatremia with excess extracellular fluid volume  PLAN:     His physical exam show evidence of volume overload.  At this time I am going to start the patient on Lasix 40 mg daily hoping that will help improve his leg edema.  For his new onset atrial fibrillation I discussed with the patient and his wife the management of atrial fibrillation which includes rhythm control, rate control as well as anticoagulation.  His heart rate is controlled on his current dose of beta-blocker.  For now we will hold off on rhythm control until he has been diuresed.  However stroke prevention in this patient is initiated we discussed his chads vas score is 3-which is a intermediate risk of stroke abdomen start the patient on Eliquis 5 mg twice a day.  For now we will stop the aspirin to decrease his risk of bleeding.  I have educated patient on the side effects of this medication. I also urge him  to abstain from any taking behaviors.  He understands that he is now at a high risk of bleeding due to being on anticoagulation.  He was also advised that if he ever falls and especially hit he head to be seen in the emergency department.  In addition echocardiogram will be done to assess  RV/LV function and for any other structural abnormalities.  Hypertension-his blood pressure deceptively in the office today.  Diabetes type 2-continue the patient on his Metformin.  Hyperlipidemia-continue pravastatin.  I am hoping that his hyponatremia is in the setting of his volume overload.  Which will really help if we can diurese the patient and his chemistry to understand his sodium.  The patient is in agreement with the above plan. The patient left the office in stable condition.  The patient will follow up in   Medication Adjustments/Labs and Tests Ordered: Current medicines are reviewed at length with the patient today.  Concerns regarding medicines are outlined above.  Orders Placed This Encounter  Procedures  . Basic metabolic panel  . Magnesium  . EKG 12-Lead  . ECHOCARDIOGRAM COMPLETE   Meds ordered this encounter  Medications  . furosemide (LASIX) 40 MG tablet    Sig: Take 1 tablet (40 mg total) by mouth daily.    Dispense:  30 tablet    Refill:  2  . apixaban (ELIQUIS) 5 MG TABS tablet    Sig: Take 1 tablet (5 mg total) by mouth 2 (two) times daily.    Dispense:  60 tablet    Refill:  1    Patient Instructions  Medication Instructions:  Your physician has recommended you make the following change in your medication:   STOP: Aspirin   START: Eliquis 5 mg twice daily   START: Lasix 40 mg daily   *If you need a refill on your cardiac medications before your next appointment, please call your pharmacy*   Lab Work: Your physician recommends that you return for lab work two days before next appointment: bmp, mg   If you have labs (blood work) drawn today and your tests are completely normal, you will receive your results only by: Marland Kitchen MyChart Message (if you have MyChart) OR . A paper copy in the mail If you have any lab test that is abnormal or we need to change your treatment, we will call you to review the results.   Testing/Procedures: Your  physician has requested that you have an echocardiogram. Echocardiography is a painless test that uses sound waves to create images of your heart. It provides your doctor  with information about the size and shape of your heart and how well your heart's chambers and valves are working. This procedure takes approximately one hour. There are no restrictions for this procedure.     Follow-Up: At [facility], you and your health needs are our priority.  As part of our continuing mission to provide you with exceptional heart care, we have created designated Provider Care Teams.  These Care Teams include your primary Cardiologist (physician) and Advanced Practice Providers (APPs -  Physician Assistants and Nurse Practitioners) who all work together to provide you with the care you need, when you need it.  We recommend signing up for the patient portal called "MyChart".  Sign up information is provided on this After Visit Summary.  MyChart is used to connect with patients for Virtual Visits (Telemedicine).  Patients are able to view lab/test results, encounter notes, upcoming appointments, etc.  Non-urgent messages can be sent to your provider as well.   To learn more about what you can do with MyChart, go to [email or web address].    Your next appointment:   1 month(s)  The format for your next appointment:   In Person  Provider:   Berniece Salines, DO   Other Instructions   Echocardiogram An echocardiogram is a procedure that uses painless sound waves (ultrasound) to produce an image of the heart. Images from an echocardiogram can provide important information about:  Signs of coronary artery disease (CAD).  Aneurysm detection. An aneurysm is a weak or damaged part of an artery wall that bulges out from the normal force of blood pumping through the body.  Heart size and shape. Changes in the size or shape of the heart can be associated with certain conditions, including heart failure,  aneurysm, and CAD.  Heart muscle function.  Heart valve function.  Signs of a past heart attack.  Fluid buildup around the heart.  Thickening of the heart muscle.  A tumor or infectious growth around the heart valves. Tell a health care provider about:  Any allergies you have.  All medicines you are taking, including vitamins, herbs, eye drops, creams, and over-the-counter medicines.  Any blood disorders you have.  Any surgeries you have had.  Any medical conditions you have.  Whether you are pregnant or may be pregnant. What are the risks? Generally, this is a safe procedure. However, problems may occur, including:  Allergic reaction to dye (contrast) that may be used during the procedure. What happens before the procedure? No specific preparation is needed. You may eat and drink normally. What happens during the procedure?   An IV tube may be inserted into one of your veins.  You may receive contrast through this tube. A contrast is an injection that improves the quality of the pictures from your heart.  A gel will be applied to your chest.  A wand-like tool (transducer) will be moved over your chest. The gel will help to transmit the sound waves from the transducer.  The sound waves will harmlessly bounce off of your heart to allow the heart images to be captured in real-time motion. The images will be recorded on a computer. The procedure may vary among health care providers and hospitals. What happens after the procedure?  You may return to your normal, everyday life, including diet, activities, and medicines, unless your health care provider tells you not to do that. Summary  An echocardiogram is a procedure that uses painless sound waves (ultrasound) to produce an image of the  heart.  Images from an echocardiogram can provide important information about the size and shape of your heart, heart muscle function, heart valve function, and fluid buildup around  your heart.  You do not need to do anything to prepare before this procedure. You may eat and drink normally.  After the echocardiogram is completed, you may return to your normal, everyday life, unless your health care provider tells you not to do that. This information is not intended to replace advice given to you by your health care provider. Make sure you discuss any questions you have with your health care provider. Document Revised: 05/28/2018 Document Reviewed: 03/09/2016 Elsevier Patient Education  [place].  Furosemide Oral Tablets What is this medicine? FUROSEMIDE (fyoor [place] se mide) is a diuretic. It helps you make more urine and to lose salt and excess water from your body. It treats swelling from heart, kidney, or liver disease. It also treats high blood pressure. This medicine may be used for other purposes; ask your health care provider or pharmacist if you have questions. COMMON BRAND NAME(S): Active-Medicated Specimen Kit, Delone, Diuscreen, Lasix, RX Specimen Collection Kit, Specimen Collection Kit, URINX Medicated Specimen Collection What should I tell my health care provider before I take this medicine? They need to know if you have any of these conditions:  abnormal blood electrolytes  diarrhea or vomiting  gout  heart disease  kidney disease, small amounts of urine, or difficulty passing urine  liver disease  thyroid disease  an unusual or allergic reaction to furosemide, sulfa drugs, other medicines, foods, dyes, or preservatives  pregnant or trying to get pregnant  breast-feeding How should I use this medicine? Take this drug by mouth. Take it as directed on the prescription label at the same time every day. You can take it with or without food. If it upsets your stomach, take it with food. Keep taking it unless your health care provider tells you to stop. Talk to your health care provider about the use of this drug in children. Special care may  be needed. Overdosage: If you think you have taken too much of this medicine contact a poison control center or emergency room at once. NOTE: This medicine is only for you. Do not share this medicine with others. What if I miss a dose? If you miss a dose, take it as soon as you can. If it is almost time for your next dose, take only that dose. Do not take double or extra doses. What may interact with this medicine?  aspirin and aspirin-like medicines  certain antibiotics  chloral hydrate  cisplatin  cyclosporine  digoxin  diuretics  laxatives  lithium  medicines for blood pressure  medicines that relax muscles for surgery  methotrexate  NSAIDs, medicines for pain and inflammation like ibuprofen, naproxen, or indomethacin  phenytoin  steroid medicines like prednisone or cortisone  sucralfate  thyroid hormones This list may not describe all possible interactions. Give your health care provider a list of all the medicines, herbs, non-prescription drugs, or dietary supplements you use. Also tell them if you smoke, drink alcohol, or use illegal drugs. Some items may interact with your medicine. What should I watch for while using this medicine? Visit your doctor or health care provider for regular checks on your progress. Check your blood pressure regularly. Ask your doctor or health care provider what your blood pressure should be, and when you should contact him or her. If you are a diabetic, check your  blood sugar as directed. This medicine may cause serious skin reactions. They can happen weeks to months after starting the medicine. Contact your health care provider right away if you notice fevers or flu-like symptoms with a rash. The rash may be red or purple and then turn into blisters or peeling of the skin. Or, you might notice a red rash with swelling of the face, lips or lymph nodes in your neck or under your arms. You may need to be on a special diet while taking  this medicine. Check with your doctor. Also, ask how many glasses of fluid you need to drink a day. You must not get dehydrated. You may get drowsy or dizzy. Do not drive, use machinery, or do anything that needs mental alertness until you know how this drug affects you. Do not stand or sit up quickly, especially if you are an older patient. This reduces the risk of dizzy or fainting spells. Alcohol can make you more drowsy and dizzy. Avoid alcoholic drinks. This medicine can make you more sensitive to the sun. Keep out of the sun. If you cannot avoid being in the sun, wear protective clothing and use sunscreen. Do not use sun lamps or tanning beds/booths. What side effects may I notice from receiving this medicine? Side effects that you should report to your doctor or health care professional as soon as possible:  blood in urine or stools  dry mouth  fever or chills  hearing loss or ringing in the ears  irregular heartbeat  muscle pain or weakness, cramps  rash, fever, and swollen lymph nodes  redness, blistering, peeling or loosening of the skin, including inside the mouth  skin rash  stomach upset, pain, or nausea  tingling or numbness in the hands or feet  unusually weak or tired  vomiting or diarrhea  yellowing of the eyes or skin Side effects that usually do not require medical attention (report to your doctor or health care professional if they continue or are bothersome):  headache  loss of appetite  unusual bleeding or bruising This list may not describe all possible side effects. Call your doctor for medical advice about side effects. You may report side effects to FDA at 1-800-FDA-1088. Where should I keep my medicine? Keep out of the reach of children and pets. Store at room temperature between 20 and 25 degrees C (68 and 77 degrees F). Protect from light and moisture. Keep the container tightly closed. Throw away any unused drug after the expiration  date. NOTE: This sheet is a summary. It may not cover all possible information. If you have questions about this medicine, talk to your doctor, pharmacist, or health care provider.  2020 Elsevier/Gold Standard (2018-09-22 18:01:32)   Apixaban oral tablets What is this medicine? APIXABAN (a PIX a ban) is an anticoagulant (blood thinner). It is used to lower the chance of stroke in people with a medical condition called atrial fibrillation. It is also used to treat or prevent blood clots in the lungs or in the veins. This medicine may be used for other purposes; ask your health care provider or pharmacist if you have questions. COMMON BRAND NAME(S): Eliquis What should I tell my health care provider before I take this medicine? They need to know if you have any of these conditions:  antiphospholipid antibody syndrome  bleeding disorders  bleeding in the brain  blood in your stools (black or tarry stools) or if you have blood in your vomit  history  of blood clots  history of stomach bleeding  kidney disease  liver disease  mechanical heart valve  an unusual or allergic reaction to apixaban, other medicines, foods, dyes, or preservatives  pregnant or trying to get pregnant  breast-feeding How should I use this medicine? Take this medicine by mouth with a glass of water. Follow the directions on the prescription label. You can take it with or without food. If it upsets your stomach, take it with food. Take your medicine at regular intervals. Do not take it more often than directed. Do not stop taking except on your doctor's advice. Stopping this medicine may increase your risk of a blood clot. Be sure to refill your prescription before you run out of medicine. Talk to your pediatrician regarding the use of this medicine in children. Special care may be needed. Overdosage: If you think you have taken too much of this medicine contact a poison control center or emergency room at  once. NOTE: This medicine is only for you. Do not share this medicine with others. What if I miss a dose? If you miss a dose, take it as soon as you can. If it is almost time for your next dose, take only that dose. Do not take double or extra doses. What may interact with this medicine? This medicine may interact with the following:  aspirin and aspirin-like medicines  certain medicines for fungal infections like ketoconazole and itraconazole  certain medicines for seizures like carbamazepine and phenytoin  certain medicines that treat or prevent blood clots like warfarin, enoxaparin, and dalteparin  clarithromycin  NSAIDs, medicines for pain and inflammation, like ibuprofen or naproxen  rifampin  ritonavir  St. John's wort This list may not describe all possible interactions. Give your health care provider a list of all the medicines, herbs, non-prescription drugs, or dietary supplements you use. Also tell them if you smoke, drink alcohol, or use illegal drugs. Some items may interact with your medicine. What should I watch for while using this medicine? Visit your healthcare professional for regular checks on your progress. You may need blood work done while you are taking this medicine. Your condition will be monitored carefully while you are receiving this medicine. It is important not to miss any appointments. Avoid sports and activities that might cause injury while you are using this medicine. Severe falls or injuries can cause unseen bleeding. Be careful when using sharp tools or knives. Consider using an Copy. Take special care brushing or flossing your teeth. Report any injuries, bruising, or red spots on the skin to your healthcare professional. If you are going to need surgery or other procedure, tell your healthcare professional that you are taking this medicine. Wear a medical ID bracelet or chain. Carry a card that describes your disease and details of your  medicine and dosage times. What side effects may I notice from receiving this medicine? Side effects that you should report to your doctor or health care professional as soon as possible:  allergic reactions like skin rash, itching or hives, swelling of the face, lips, or tongue  signs and symptoms of bleeding such as bloody or black, tarry stools; red or dark-brown urine; spitting up blood or brown material that looks like coffee grounds; red spots on the skin; unusual bruising or bleeding from the eye, gums, or nose  signs and symptoms of a blood clot such as chest pain; shortness of breath; pain, swelling, or warmth in the leg  signs and symptoms  of a stroke such as changes in vision; confusion; trouble speaking or understanding; severe headaches; sudden numbness or weakness of the face, arm or leg; trouble walking; dizziness; loss of coordination This list may not describe all possible side effects. Call your doctor for medical advice about side effects. You may report side effects to FDA at 1-800-FDA-1088. Where should I keep my medicine? Keep out of the reach of children. Store at room temperature between 20 and 25 degrees C (68 and 77 degrees F). Throw away any unused medicine after the expiration date. NOTE: This sheet is a summary. It may not cover all possible information. If you have questions about this medicine, talk to your doctor, pharmacist, or health care provider.  2020 Elsevier/Gold Standard (2017-10-15 17:39:34)     Adopting a Healthy Lifestyle.  Know what a healthy weight is for you (roughly BMI <25) and aim to maintain this   Aim for 7+ servings of fruits and vegetables daily   65-80+ fluid ounces of water or unsweet tea for healthy kidneys   Limit to max 1 drink of alcohol per day; avoid smoking/tobacco   Limit animal fats in diet for cholesterol and heart health - choose grass fed whenever available   Avoid highly processed foods, and foods high in  saturated/trans fats   Aim for low stress - take time to unwind and care for your mental health   Aim for 150 min of moderate intensity exercise weekly for heart health, and weights twice weekly for bone health   Aim for 7-9 hours of sleep daily   When it comes to diets, agreement about the perfect plan isnt easy to find, even among the experts. Experts at the [place] developed an idea known as the Healthy Eating Plate. Just imagine a plate divided into logical, healthy portions.   The emphasis is on diet quality:   Load up on vegetables and fruits - one-half of your plate: Aim for color and variety, and remember that potatoes dont count.   Go for whole grains - one-quarter of your plate: Whole wheat, barley, wheat berries, quinoa, oats, brown rice, and foods made with them. If you want pasta, go with whole wheat pasta.   Protein power - one-quarter of your plate: Fish, chicken, beans, and nuts are all healthy, versatile protein sources. Limit red meat.   The diet, however, does go beyond the plate, offering a few other suggestions.   Use healthy plant oils, such as olive, canola, soy, corn, sunflower and peanut. Check the labels, and avoid partially hydrogenated oil, which have unhealthy trans fats.   If youre thirsty, drink water. Coffee and tea are good in moderation, but skip sugary drinks and limit milk and dairy products to one or two daily servings.   The type of carbohydrate in the diet is more important than the amount. Some sources of carbohydrates, such as vegetables, fruits, whole grains, and beans-are healthier than others.   Finally, stay active  Signed, Berniece Salines, DO  07/28/2019 11:17 AM    [place]

## 2019-07-27 NOTE — Telephone Encounter (Signed)
Pt signed records release to have office notes, labs and test results sent to Dr. Melbourne Abts at the [facility] in [place]. Need to send records after pt has echo done.

## 2019-07-27 NOTE — Patient Instructions (Signed)
Medication Instructions:  Your physician has recommended you make the following change in your medication:   STOP: Aspirin   START: Eliquis 5 mg twice daily   START: Lasix 40 mg daily   *If you need a refill on your cardiac medications before your next appointment, please call your pharmacy*   Lab Work: Your physician recommends that you return for lab work two days before next appointment: bmp, mg   If you have labs (blood work) drawn today and your tests are completely normal, you will receive your results only by: Marland Kitchen MyChart Message (if you have MyChart) OR . A paper copy in the mail If you have any lab test that is abnormal or we need to change your treatment, we will call you to review the results.   Testing/Procedures: Your physician has requested that you have an echocardiogram. Echocardiography is a painless test that uses sound waves to create images of your heart. It provides your doctor with information about the size and shape of your heart and how well your heart's chambers and valves are working. This procedure takes approximately one hour. There are no restrictions for this procedure.     Follow-Up: At [facility], you and your health needs are our priority.  As part of our continuing mission to provide you with exceptional heart care, we have created designated Provider Care Teams.  These Care Teams include your primary Cardiologist (physician) and Advanced Practice Providers (APPs -  Physician Assistants and Nurse Practitioners) who all work together to provide you with the care you need, when you need it.  We recommend signing up for the patient portal called "MyChart".  Sign up information is provided on this After Visit Summary.  MyChart is used to connect with patients for Virtual Visits (Telemedicine).  Patients are able to view lab/test results, encounter notes, upcoming appointments, etc.  Non-urgent messages can be sent to your provider as well.   To learn  more about what you can do with MyChart, go to [email or web address].    Your next appointment:   1 month(s)  The format for your next appointment:   In Person  Provider:   Berniece Salines, DO   Other Instructions   Echocardiogram An echocardiogram is a procedure that uses painless sound waves (ultrasound) to produce an image of the heart. Images from an echocardiogram can provide important information about:  Signs of coronary artery disease (CAD).  Aneurysm detection. An aneurysm is a weak or damaged part of an artery wall that bulges out from the normal force of blood pumping through the body.  Heart size and shape. Changes in the size or shape of the heart can be associated with certain conditions, including heart failure, aneurysm, and CAD.  Heart muscle function.  Heart valve function.  Signs of a past heart attack.  Fluid buildup around the heart.  Thickening of the heart muscle.  A tumor or infectious growth around the heart valves. Tell a health care provider about:  Any allergies you have.  All medicines you are taking, including vitamins, herbs, eye drops, creams, and over-the-counter medicines.  Any blood disorders you have.  Any surgeries you have had.  Any medical conditions you have.  Whether you are pregnant or may be pregnant. What are the risks? Generally, this is a safe procedure. However, problems may occur, including:  Allergic reaction to dye (contrast) that may be used during the procedure. What happens before the procedure? No specific preparation is needed. You may  eat and drink normally. What happens during the procedure?   An IV tube may be inserted into one of your veins.  You may receive contrast through this tube. A contrast is an injection that improves the quality of the pictures from your heart.  A gel will be applied to your chest.  A wand-like tool (transducer) will be moved over your chest. The gel will help to transmit  the sound waves from the transducer.  The sound waves will harmlessly bounce off of your heart to allow the heart images to be captured in real-time motion. The images will be recorded on a computer. The procedure may vary among health care providers and hospitals. What happens after the procedure?  You may return to your normal, everyday life, including diet, activities, and medicines, unless your health care provider tells you not to do that. Summary  An echocardiogram is a procedure that uses painless sound waves (ultrasound) to produce an image of the heart.  Images from an echocardiogram can provide important information about the size and shape of your heart, heart muscle function, heart valve function, and fluid buildup around your heart.  You do not need to do anything to prepare before this procedure. You may eat and drink normally.  After the echocardiogram is completed, you may return to your normal, everyday life, unless your health care provider tells you not to do that. This information is not intended to replace advice given to you by your health care provider. Make sure you discuss any questions you have with your health care provider. Document Revised: 05/28/2018 Document Reviewed: 03/09/2016 Elsevier Patient Education  [place].  Furosemide Oral Tablets What is this medicine? FUROSEMIDE (fyoor OH se mide) is a diuretic. It helps you make more urine and to lose salt and excess water from your body. It treats swelling from heart, kidney, or liver disease. It also treats high blood pressure. This medicine may be used for other purposes; ask your health care provider or pharmacist if you have questions. COMMON BRAND NAME(S): Active-Medicated Specimen Kit, Delone, Diuscreen, Lasix, RX Specimen Collection Kit, Specimen Collection Kit, URINX Medicated Specimen Collection What should I tell my health care provider before I take this medicine? They need to know if you  have any of these conditions:  abnormal blood electrolytes  diarrhea or vomiting  gout  heart disease  kidney disease, small amounts of urine, or difficulty passing urine  liver disease  thyroid disease  an unusual or allergic reaction to furosemide, sulfa drugs, other medicines, foods, dyes, or preservatives  pregnant or trying to get pregnant  breast-feeding How should I use this medicine? Take this drug by mouth. Take it as directed on the prescription label at the same time every day. You can take it with or without food. If it upsets your stomach, take it with food. Keep taking it unless your health care provider tells you to stop. Talk to your health care provider about the use of this drug in children. Special care may be needed. Overdosage: If you think you have taken too much of this medicine contact a poison control center or emergency room at once. NOTE: This medicine is only for you. Do not share this medicine with others. What if I miss a dose? If you miss a dose, take it as soon as you can. If it is almost time for your next dose, take only that dose. Do not take double or extra doses. What may interact with  this medicine?  aspirin and aspirin-like medicines  certain antibiotics  chloral hydrate  cisplatin  cyclosporine  digoxin  diuretics  laxatives  lithium  medicines for blood pressure  medicines that relax muscles for surgery  methotrexate  NSAIDs, medicines for pain and inflammation like ibuprofen, naproxen, or indomethacin  phenytoin  steroid medicines like prednisone or cortisone  sucralfate  thyroid hormones This list may not describe all possible interactions. Give your health care provider a list of all the medicines, herbs, non-prescription drugs, or dietary supplements you use. Also tell them if you smoke, drink alcohol, or use illegal drugs. Some items may interact with your medicine. What should I watch for while using this  medicine? Visit your doctor or health care provider for regular checks on your progress. Check your blood pressure regularly. Ask your doctor or health care provider what your blood pressure should be, and when you should contact him or her. If you are a diabetic, check your blood sugar as directed. This medicine may cause serious skin reactions. They can happen weeks to months after starting the medicine. Contact your health care provider right away if you notice fevers or flu-like symptoms with a rash. The rash may be red or purple and then turn into blisters or peeling of the skin. Or, you might notice a red rash with swelling of the face, lips or lymph nodes in your neck or under your arms. You may need to be on a special diet while taking this medicine. Check with your doctor. Also, ask how many glasses of fluid you need to drink a day. You must not get dehydrated. You may get drowsy or dizzy. Do not drive, use machinery, or do anything that needs mental alertness until you know how this drug affects you. Do not stand or sit up quickly, especially if you are an older patient. This reduces the risk of dizzy or fainting spells. Alcohol can make you more drowsy and dizzy. Avoid alcoholic drinks. This medicine can make you more sensitive to the sun. Keep out of the sun. If you cannot avoid being in the sun, wear protective clothing and use sunscreen. Do not use sun lamps or tanning beds/booths. What side effects may I notice from receiving this medicine? Side effects that you should report to your doctor or health care professional as soon as possible:  blood in urine or stools  dry mouth  fever or chills  hearing loss or ringing in the ears  irregular heartbeat  muscle pain or weakness, cramps  rash, fever, and swollen lymph nodes  redness, blistering, peeling or loosening of the skin, including inside the mouth  skin rash  stomach upset, pain, or nausea  tingling or numbness in the  hands or feet  unusually weak or tired  vomiting or diarrhea  yellowing of the eyes or skin Side effects that usually do not require medical attention (report to your doctor or health care professional if they continue or are bothersome):  headache  loss of appetite  unusual bleeding or bruising This list may not describe all possible side effects. Call your doctor for medical advice about side effects. You may report side effects to FDA at 1-800-FDA-1088. Where should I keep my medicine? Keep out of the reach of children and pets. Store at room temperature between 20 and 25 degrees C (68 and 77 degrees F). Protect from light and moisture. Keep the container tightly closed. Throw away any unused drug after the expiration date. NOTE:  This sheet is a summary. It may not cover all possible information. If you have questions about this medicine, talk to your doctor, pharmacist, or health care provider.  2020 Elsevier/Gold Standard (2018-09-22 18:01:32)   Apixaban oral tablets What is this medicine? APIXABAN (a PIX a ban) is an anticoagulant (blood thinner). It is used to lower the chance of stroke in people with a medical condition called atrial fibrillation. It is also used to treat or prevent blood clots in the lungs or in the veins. This medicine may be used for other purposes; ask your health care provider or pharmacist if you have questions. COMMON BRAND NAME(S): Eliquis What should I tell my health care provider before I take this medicine? They need to know if you have any of these conditions:  antiphospholipid antibody syndrome  bleeding disorders  bleeding in the brain  blood in your stools (black or tarry stools) or if you have blood in your vomit  history of blood clots  history of stomach bleeding  kidney disease  liver disease  mechanical heart valve  an unusual or allergic reaction to apixaban, other medicines, foods, dyes, or preservatives  pregnant or  trying to get pregnant  breast-feeding How should I use this medicine? Take this medicine by mouth with a glass of water. Follow the directions on the prescription label. You can take it with or without food. If it upsets your stomach, take it with food. Take your medicine at regular intervals. Do not take it more often than directed. Do not stop taking except on your doctor's advice. Stopping this medicine may increase your risk of a blood clot. Be sure to refill your prescription before you run out of medicine. Talk to your pediatrician regarding the use of this medicine in children. Special care may be needed. Overdosage: If you think you have taken too much of this medicine contact a poison control center or emergency room at once. NOTE: This medicine is only for you. Do not share this medicine with others. What if I miss a dose? If you miss a dose, take it as soon as you can. If it is almost time for your next dose, take only that dose. Do not take double or extra doses. What may interact with this medicine? This medicine may interact with the following:  aspirin and aspirin-like medicines  certain medicines for fungal infections like ketoconazole and itraconazole  certain medicines for seizures like carbamazepine and phenytoin  certain medicines that treat or prevent blood clots like warfarin, enoxaparin, and dalteparin  clarithromycin  NSAIDs, medicines for pain and inflammation, like ibuprofen or naproxen  rifampin  ritonavir  St. John's wort This list may not describe all possible interactions. Give your health care provider a list of all the medicines, herbs, non-prescription drugs, or dietary supplements you use. Also tell them if you smoke, drink alcohol, or use illegal drugs. Some items may interact with your medicine. What should I watch for while using this medicine? Visit your healthcare professional for regular checks on your progress. You may need blood work done  while you are taking this medicine. Your condition will be monitored carefully while you are receiving this medicine. It is important not to miss any appointments. Avoid sports and activities that might cause injury while you are using this medicine. Severe falls or injuries can cause unseen bleeding. Be careful when using sharp tools or knives. Consider using an Copy. Take special care brushing or flossing your teeth. Report any  injuries, bruising, or red spots on the skin to your healthcare professional. If you are going to need surgery or other procedure, tell your healthcare professional that you are taking this medicine. Wear a medical ID bracelet or chain. Carry a card that describes your disease and details of your medicine and dosage times. What side effects may I notice from receiving this medicine? Side effects that you should report to your doctor or health care professional as soon as possible:  allergic reactions like skin rash, itching or hives, swelling of the face, lips, or tongue  signs and symptoms of bleeding such as bloody or black, tarry stools; red or dark-brown urine; spitting up blood or brown material that looks like coffee grounds; red spots on the skin; unusual bruising or bleeding from the eye, gums, or nose  signs and symptoms of a blood clot such as chest pain; shortness of breath; pain, swelling, or warmth in the leg  signs and symptoms of a stroke such as changes in vision; confusion; trouble speaking or understanding; severe headaches; sudden numbness or weakness of the face, arm or leg; trouble walking; dizziness; loss of coordination This list may not describe all possible side effects. Call your doctor for medical advice about side effects. You may report side effects to FDA at 1-800-FDA-1088. Where should I keep my medicine? Keep out of the reach of children. Store at room temperature between 20 and 25 degrees C (68 and 77 degrees F). Throw away any  unused medicine after the expiration date. NOTE: This sheet is a summary. It may not cover all possible information. If you have questions about this medicine, talk to your doctor, pharmacist, or health care provider.  2020 Elsevier/Gold Standard (2017-10-15 17:39:34)

## 2019-08-10 ENCOUNTER — Encounter: Payer: Self-pay | Admitting: *Deleted

## 2019-08-10 DIAGNOSIS — E785 Hyperlipidemia, unspecified: Secondary | ICD-10-CM | POA: Diagnosis not present

## 2019-08-10 DIAGNOSIS — R0602 Shortness of breath: Secondary | ICD-10-CM | POA: Diagnosis not present

## 2019-08-11 ENCOUNTER — Telehealth: Payer: Self-pay

## 2019-08-11 LAB — BASIC METABOLIC PANEL
BUN/Creatinine Ratio: 11 (ref 10–24)
BUN: 11 mg/dL (ref 8–27)
CO2: 23 mmol/L (ref 20–29)
Calcium: 9.5 mg/dL (ref 8.6–10.2)
Chloride: 98 mmol/L (ref 96–106)
Creatinine, Ser: 0.97 mg/dL (ref 0.76–1.27)
GFR calc Af Amer: 86 mL/min/{1.73_m2} (ref >59)
GFR calc non Af Amer: 74 mL/min/{1.73_m2} (ref >59)
Glucose: 148 mg/dL — ABNORMAL HIGH (ref 65–99)
Potassium: 4.4 mmol/L (ref 3.5–5.2)
Sodium: 137 mmol/L (ref 134–144)

## 2019-08-11 LAB — MAGNESIUM: Magnesium: 1.9 mg/dL (ref 1.6–2.3)

## 2019-08-11 NOTE — Telephone Encounter (Signed)
Spoke with patient regarding results and recommendation.  Patient verbalizes understanding and is agreeable to plan of care. Advised patient to call back with any issues or concerns.  

## 2019-08-11 NOTE — Telephone Encounter (Signed)
-----   Message from Loel Dubonnet, NP sent at 08/11/2019  7:38 AM EDT ----- Normal renal function and electrolytes. Glucose elevated, as he is diabetic and this is a non-fasting lab, not of concern.

## 2019-08-12 ENCOUNTER — Other Ambulatory Visit: Payer: Self-pay

## 2019-08-12 ENCOUNTER — Ambulatory Visit (INDEPENDENT_AMBULATORY_CARE_PROVIDER_SITE_OTHER): Payer: PPO

## 2019-08-12 DIAGNOSIS — R0602 Shortness of breath: Secondary | ICD-10-CM

## 2019-08-12 DIAGNOSIS — E785 Hyperlipidemia, unspecified: Secondary | ICD-10-CM

## 2019-08-12 NOTE — Progress Notes (Signed)
Complete echocardiogram has been performed.  Jimmy Davell Beckstead RDCS, RVT 

## 2019-08-13 ENCOUNTER — Telehealth: Payer: Self-pay | Admitting: *Deleted

## 2019-08-13 NOTE — Telephone Encounter (Signed)
Faxed office notes from 07/28/19 and echo results to Dr. Melbourne Abts at [facility] per Pt. (release was signed by pt)

## 2019-08-16 ENCOUNTER — Telehealth: Payer: Self-pay

## 2019-08-16 ENCOUNTER — Telehealth: Payer: Self-pay | Admitting: Cardiology

## 2019-08-16 NOTE — Telephone Encounter (Signed)
Left message on patients voicemail to please return our call.   

## 2019-08-16 NOTE — Telephone Encounter (Signed)
Connected call to [facility]

## 2019-08-16 NOTE — Telephone Encounter (Signed)
Tried calling patient. No answer and phone line is busy so I can not leave a message at this time.

## 2019-08-16 NOTE — Telephone Encounter (Signed)
-----   Message from Loel Dubonnet, NP sent at 08/16/2019  4:31 PM EDT ----- Echocardiogram shows normal heart pumping function. His heart is mildly stiff. Mild thickening of LV heart muscle - likely due to elevated blood pressure. Left atrium mildly dilated. No significant valvular abnormality. Overall good result. Can be discussed at upcoming follow up with Dr. Harriet Masson.

## 2019-08-16 NOTE — Telephone Encounter (Signed)
Spoke to the patient just now and he states that he has not been able to weigh himself recently but the swelling in his lower extremities has gone down and is back to normal at this time.   I advised him to cut back on his Lasix to 20 mg daily and to use some hard candies or peppermints to keep his mouth moist. He verbalizes understanding and thanks me for the call back.

## 2019-08-16 NOTE — Telephone Encounter (Signed)
Pt c/o medication issue:  1. Name of Medication: furosemide (LASIX) 40 MG tablet  2. How are you currently taking this medication (dosage and times per day)? As directed   3. Are you having a reaction (difficulty breathing--STAT)? No  4. What is your medication issue? Pt says he is so dry he is having a hard time swallowing.

## 2019-08-16 NOTE — Telephone Encounter (Signed)
Spoke to the patient just now and he let me know that he is extremely dry after starting his Lasix. He said that he is having to use cough drops just to keep his throat/mouth moist and he is having a hard time swallowing his pills because he is so dry. He states that his skin is also very dry and even when using moisturizer cream it is very rough/dry.   I will route to Laurann Montana, NP for further recommendations.

## 2019-08-16 NOTE — Telephone Encounter (Signed)
Does he have the ability to weigh himself at home? Are we able to assess how many pounds he has lost since diuresis? How is his lower extremity edema?  Would recommend using hard candies or peppermints to help keep his mouth dry.   He may reduce Lasix to 20mg  daily (half tablet) until he sees Dr. Harriet Masson next week.   Loel Dubonnet, NP

## 2019-08-17 ENCOUNTER — Telehealth: Payer: Self-pay

## 2019-08-17 NOTE — Telephone Encounter (Signed)
-----   Message from Loel Dubonnet, NP sent at 08/16/2019  4:31 PM EDT ----- Echocardiogram shows normal heart pumping function. His heart is mildly stiff. Mild thickening of LV heart muscle - likely due to elevated blood pressure. Left atrium mildly dilated. No significant valvular abnormality. Overall good result. Can be discussed at upcoming follow up with Dr. Harriet Masson.

## 2019-08-17 NOTE — Telephone Encounter (Signed)
Spoke with patient regarding results and recommendation.  Patient verbalizes understanding and is agreeable to plan of care. Advised patient to call back with any issues or concerns.  

## 2019-08-26 ENCOUNTER — Encounter: Payer: Self-pay | Admitting: Cardiology

## 2019-08-26 ENCOUNTER — Other Ambulatory Visit: Payer: Self-pay

## 2019-08-26 ENCOUNTER — Ambulatory Visit: Payer: PPO | Admitting: Cardiology

## 2019-08-26 VITALS — BP 132/70 | HR 80 | Ht 75.0 in | Wt 266.0 lb

## 2019-08-26 DIAGNOSIS — R5383 Other fatigue: Secondary | ICD-10-CM

## 2019-08-26 DIAGNOSIS — E118 Type 2 diabetes mellitus with unspecified complications: Secondary | ICD-10-CM | POA: Diagnosis not present

## 2019-08-26 DIAGNOSIS — I10 Essential (primary) hypertension: Secondary | ICD-10-CM | POA: Diagnosis not present

## 2019-08-26 DIAGNOSIS — I480 Paroxysmal atrial fibrillation: Secondary | ICD-10-CM | POA: Diagnosis not present

## 2019-08-26 MED ORDER — FUROSEMIDE 20 MG PO TABS: 20.0000 mg | ORAL_TABLET | ORAL | 3 refills | Status: AC

## 2019-08-26 NOTE — Progress Notes (Signed)
Cardiology Office Note:    Date:  08/26/2019   ID:  Benjamin Boyd, DOB 1940/08/15, MRN [id number]  PCP:  Street, Sharon Mt, MD  Cardiologist:  Berniece Salines, DO  Electrophysiologist:  None   Referring MD: Street, Sharon Mt, *   Chief Complaint  Patient presents with  . Follow-up    1 Month   History of Present Illness:    Benjamin Boyd is a 79 y.o. male with a hx of Diabetes Mellitus, Hyperlipidemia, Hypertension , recently diagnosed atrial fibrillation on Eliquis, morbid obesity.  Patient presents today for follow-up visit he tells me that he has been feeling a little weak and fatigued at home.  He denies any chest pain or shortness of breath.  He has several questions during his visit today he wanted to know if he was going to be on the Eliquis for a long time.  As well as discussing the doses of his Lasix.  He is happy that his leg swelling has improved significantly and he has lost close to 13 pounds.  Past Medical History:  Diagnosis Date  . Diabetes type 2, controlled ([place])   . Hyperlipidemia   . Hypertension   . Morbid obesity ([place])   . Paroxysmal atrial fibrillation [facility])     Past Surgical History:  Procedure Laterality Date  . BACK SURGERY    . basil cell    . CATARACT EXTRACTION    . CHOLECYSTECTOMY OPEN    . CYST REMOVAL TRUNK    . RETINAL DETACHMENT SURGERY      Current Medications: Current Meds  Medication Sig  . amLODipine (NORVASC) 10 MG tablet Take 10 mg by mouth daily.  Marland Kitchen apixaban (ELIQUIS) 5 MG TABS tablet Take 1 tablet (5 mg total) by mouth 2 (two) times daily.  Marland Kitchen azelastine (ASTELIN) 0.1 % nasal spray Place into both nostrils 2 (two) times daily. Use in each nostril as directed  . gabapentin (NEURONTIN) 300 MG capsule Take 300 mg by mouth daily.  . hydrALAZINE (APRESOLINE) 100 MG tablet Take 100 mg by mouth daily.  . hydrOXYzine (ATARAX/VISTARIL) 10 MG tablet Take 10 mg by mouth daily.  Marland Kitchen lisinopril (ZESTRIL) 40 MG tablet Take 40 mg by  mouth daily.  Marland Kitchen loratadine (CLARITIN) 10 MG tablet Take 10 mg by mouth daily.  . metFORMIN (GLUCOPHAGE-XR) 500 MG 24 hr tablet Take 500 mg by mouth daily with breakfast.  . metoprolol tartrate (LOPRESSOR) 50 MG tablet Take 50 mg by mouth 2 (two) times daily.  . pantoprazole (PROTONIX) 40 MG tablet Take 40 mg by mouth daily.  . pravastatin (PRAVACHOL) 40 MG tablet Take 40 mg by mouth daily.  Marland Kitchen spironolactone (ALDACTONE) 25 MG tablet Take 12.5 mg by mouth daily.   . tamsulosin (FLOMAX) 0.4 MG CAPS capsule Take 0.4 mg by mouth daily.  . [DISCONTINUED] furosemide (LASIX) 40 MG tablet Take 1 tablet (40 mg total) by mouth daily. (Patient taking differently: Take 20 mg by mouth daily. )     Allergies:   Patient has no known allergies.   Social History   Socioeconomic History  . Marital status: Married    Spouse name: Not on file  . Number of children: Not on file  . Years of education: Not on file  . Highest education level: Not on file  Occupational History  . Not on file  Tobacco Use  . Smoking status: Former Research scientist (life sciences)  . Smokeless tobacco: Never Used  Substance and Sexual Activity  . Alcohol  use: Never  . Drug use: Never  . Sexual activity: Not on file  Other Topics Concern  . Not on file  Social History Narrative  . Not on file   Social Determinants of Health   Financial Resource Strain:   . Difficulty of Paying Living Expenses:   Food Insecurity:   . Worried About Charity fundraiser in the Last Year:   . Arboriculturist in the Last Year:   Transportation Needs:   . Film/video editor (Medical):   Marland Kitchen Lack of Transportation (Non-Medical):   Physical Activity:   . Days of Exercise per Week:   . Minutes of Exercise per Session:   Stress:   . Feeling of Stress :   Social Connections:   . Frequency of Communication with Friends and Family:   . Frequency of Social Gatherings with Friends and Family:   . Attends Religious Services:   . Active Member of Clubs or  Organizations:   . Attends Archivist Meetings:   Marland Kitchen Marital Status:      Family History: The patient's family history includes Diabetes in his brother and sister; High blood pressure in his mother and sister.  ROS:   Review of Systems  Constitution: Negative for decreased appetite, fever and weight gain.  HENT: Negative for congestion, ear discharge, hoarse voice and sore throat.   Eyes: Negative for discharge, redness, vision loss in right eye and visual halos.  Cardiovascular: Negative for chest pain, dyspnea on exertion, leg swelling, orthopnea and palpitations.  Respiratory: Negative for cough, hemoptysis, shortness of breath and snoring.   Endocrine: Negative for heat intolerance and polyphagia.  Hematologic/Lymphatic: Negative for bleeding problem. Does not bruise/bleed easily.  Skin: Negative for flushing, nail changes, rash and suspicious lesions.  Musculoskeletal: Negative for arthritis, joint pain, muscle cramps, myalgias, neck pain and stiffness.  Gastrointestinal: Negative for abdominal pain, bowel incontinence, diarrhea and excessive appetite.  Genitourinary: Negative for decreased libido, genital sores and incomplete emptying.  Neurological: Negative for brief paralysis, focal weakness, headaches and loss of balance.  Psychiatric/Behavioral: Negative for altered mental status, depression and suicidal ideas.  Allergic/Immunologic: Negative for HIV exposure and persistent infections.    EKGs/Labs/Other Studies Reviewed:    The following studies were reviewed today:   EKG:  None today   Echo IMPRESSIONS  1. Left ventricular ejection fraction, by estimation, is 55 to 60%. The left ventricle has normal function. The left ventricle has no regional wall motion abnormalities. There is mild concentric left ventricular hypertrophy. Left ventricular diastolic  parameters are consistent with Grade I diastolic dysfunction (impaired relaxation). Elevated left ventricular  end-diastolic pressure.  2. Right ventricular systolic function is normal. The right ventricular size is normal. There is normal pulmonary artery systolic pressure.  3. Left atrial size was mildly dilated.  4. The mitral valve is normal in structure. No evidence of mitral valve regurgitation. No evidence of mitral stenosis.  5. The aortic valve is normal in structure. Aortic valve regurgitation is not visualized. No aortic stenosis is present.  6. The inferior vena cava is normal in size with greater than 50% respiratory variability, suggesting right atrial pressure of 3 mmHg.   Recent Labs: 08/10/2019: BUN 11; Creatinine, Ser 0.97; Magnesium 1.9; Potassium 4.4; Sodium 137  Recent Lipid Panel No results found for: CHOL, TRIG, HDL, CHOLHDL, VLDL, LDLCALC, LDLDIRECT  Physical Exam:    VS:  BP 132/70 (BP Location: Right Arm, Patient Position: Sitting, Cuff Size: Normal)  Pulse 80   Ht 6\' 3"  (1.905 m)   Wt 266 lb (120.7 kg)   SpO2 97%   BMI 33.25 kg/m     Wt Readings from Last 3 Encounters:  08/26/19 266 lb (120.7 kg)  07/27/19 278 lb 6.4 oz (126.3 kg)     GEN: Well nourished, well developed in no acute distress HEENT: Normal NECK: No JVD; No carotid bruits LYMPHATICS: No lymphadenopathy CARDIAC: S1S2 noted,RRR, no murmurs, rubs, gallops RESPIRATORY:  Clear to auscultation without rales, wheezing or rhonchi  ABDOMEN: Soft, non-tender, non-distended, +bowel sounds, no guarding. EXTREMITIES: No edema, No cyanosis, no clubbing MUSCULOSKELETAL:  No deformity  SKIN: Warm and dry NEUROLOGIC:  Alert and oriented x 3, non-focal PSYCHIATRIC:  Normal affect, good insight  ASSESSMENT:    1. PAF (paroxysmal atrial fibrillation) ([place])   2. Fatigue, unspecified type   3. Essential hypertension   4. Type 2 diabetes mellitus with complication, without long-term current use of insulin ([place])   5. Morbid obesity ([place])    PLAN:     1.  Atrial fibrillation-we discussed the different  options for anticoagulation with the patient today.  [facility].  For now he will remain on the Eliquis 5 mg twice daily.  Will be given the patient some samples for 22-month supply.  He is on rate control with Lopressor 50 mg twice daily.  We discussed possibility of her rhythm control for the patient tells me for now he prefers to hold off on any further plans for DC cardioversion.  As I do suspect his fatigue is likely in the setting of his somatic atrial fibrillation  2.  Hypertension-his blood pressure deceptively in the office today.  We will continue patient his current medication regimen.  3.  Hyperlipidemia continue patient his pravastatin.  4.  Diabetes mellitus continue patient his current medication for diabetes per his PCP.  5.  Blood work will be done today which will assess his kidney function as well as electrolytes.  We will also get vitamin D level given the fact that the patient's fatigue.  The patient is in agreement with the above plan. The patient left the office in stable condition.  The patient will follow up in   Medication Adjustments/Labs and Tests Ordered: Current medicines are reviewed at length with the patient today.  Concerns regarding medicines are outlined above.  Orders Placed This Encounter  Procedures  . Basic metabolic panel  . Magnesium  . VITAMIN D 25 Hydroxy (Vit-D Deficiency, Fractures)   Meds ordered this encounter  Medications  . furosemide (LASIX) 20 MG tablet    Sig: Take 1 tablet (20 mg total) by mouth 3 (three) times a week.    Dispense:  48 tablet    Refill:  3    Patient Instructions  Medication Instructions:  Your physician has recommended you make the following change in your medication:  DECREASE: Lasix 20 mg take one tablet by mouth on Tuesday, Thursday, and Saturday. *If you need a refill on your cardiac medications before your next appointment, please call your  pharmacy*   Lab Work: Your physician recommends that you return for lab work in: TODAY BMP, Mag, Vitamin D If you have labs (blood work) drawn today and your tests are completely normal, you will receive your results only by: Marland Kitchen MyChart Message (if you have MyChart) OR . A paper copy in the mail  If you have any lab test that is abnormal or we need to change your treatment, we will call you to review the results.   Testing/Procedures: None   Follow-Up: At [facility], you and your health needs are our priority.  As part of our continuing mission to provide you with exceptional heart care, we have created designated Provider Care Teams.  These Care Teams include your primary Cardiologist (physician) and Advanced Practice Providers (APPs -  Physician Assistants and Nurse Practitioners) who all work together to provide you with the care you need, when you need it.  We recommend signing up for the patient portal called "MyChart".  Sign up information is provided on this After Visit Summary.  MyChart is used to connect with patients for Virtual Visits (Telemedicine).  Patients are able to view lab/test results, encounter notes, upcoming appointments, etc.  Non-urgent messages can be sent to your provider as well.   To learn more about what you can do with MyChart, go to [email or web address].    Your next appointment:   3 month(s)  The format for your next appointment:   In Person  Provider:   Berniece Salines, DO   Other Instructions      Adopting a Healthy Lifestyle.  Know what a healthy weight is for you (roughly BMI <25) and aim to maintain this   Aim for 7+ servings of fruits and vegetables daily   65-80+ fluid ounces of water or unsweet tea for healthy kidneys   Limit to max 1 drink of alcohol per day; avoid smoking/tobacco   Limit animal fats in diet for cholesterol and heart health - choose grass fed whenever available   Avoid highly processed foods, and foods high  in saturated/trans fats   Aim for low stress - take time to unwind and care for your mental health   Aim for 150 min of moderate intensity exercise weekly for heart health, and weights twice weekly for bone health   Aim for 7-9 hours of sleep daily   When it comes to diets, agreement about the perfect plan isnt easy to find, even among the experts. Experts at the [place] developed an idea known as the Healthy Eating Plate. Just imagine a plate divided into logical, healthy portions.   The emphasis is on diet quality:   Load up on vegetables and fruits - one-half of your plate: Aim for color and variety, and remember that potatoes dont count.   Go for whole grains - one-quarter of your plate: Whole wheat, barley, wheat berries, quinoa, oats, brown rice, and foods made with them. If you want pasta, go with whole wheat pasta.   Protein power - one-quarter of your plate: Fish, chicken, beans, and nuts are all healthy, versatile protein sources. Limit red meat.   The diet, however, does go beyond the plate, offering a few other suggestions.   Use healthy plant oils, such as olive, canola, soy, corn, sunflower and peanut. Check the labels, and avoid partially hydrogenated oil, which have unhealthy trans fats.   If youre thirsty, drink water. Coffee and tea are good in moderation, but skip sugary drinks and limit milk and dairy products to one or two daily servings.   The type of carbohydrate in the diet is more important than the amount. Some sources of carbohydrates, such as vegetables, fruits, whole grains, and beans-are healthier than others.   Finally, stay active  Signed, Berniece Salines, DO  08/26/2019 9:28 AM  [facility]

## 2019-08-26 NOTE — Patient Instructions (Signed)
Medication Instructions:  Your physician has recommended you make the following change in your medication:  DECREASE: Lasix 20 mg take one tablet by mouth on Tuesday, Thursday, and Saturday. *If you need a refill on your cardiac medications before your next appointment, please call your pharmacy*   Lab Work: Your physician recommends that you return for lab work in: TODAY BMP, Mag, Vitamin D If you have labs (blood work) drawn today and your tests are completely normal, you will receive your results only by: Marland Kitchen MyChart Message (if you have MyChart) OR . A paper copy in the mail If you have any lab test that is abnormal or we need to change your treatment, we will call you to review the results.   Testing/Procedures: None   Follow-Up: At [facility], you and your health needs are our priority.  As part of our continuing mission to provide you with exceptional heart care, we have created designated Provider Care Teams.  These Care Teams include your primary Cardiologist (physician) and Advanced Practice Providers (APPs -  Physician Assistants and Nurse Practitioners) who all work together to provide you with the care you need, when you need it.  We recommend signing up for the patient portal called "MyChart".  Sign up information is provided on this After Visit Summary.  MyChart is used to connect with patients for Virtual Visits (Telemedicine).  Patients are able to view lab/test results, encounter notes, upcoming appointments, etc.  Non-urgent messages can be sent to your provider as well.   To learn more about what you can do with MyChart, go to [email or web address].    Your next appointment:   3 month(s)  The format for your next appointment:   In Person  Provider:   Berniece Salines, DO   Other Instructions

## 2019-08-27 ENCOUNTER — Telehealth: Payer: Self-pay

## 2019-08-27 LAB — BASIC METABOLIC PANEL
BUN/Creatinine Ratio: 9 — ABNORMAL LOW (ref 10–24)
BUN: 9 mg/dL (ref 8–27)
CO2: 22 mmol/L (ref 20–29)
Calcium: 9.7 mg/dL (ref 8.6–10.2)
Chloride: 96 mmol/L (ref 96–106)
Creatinine, Ser: 1.02 mg/dL (ref 0.76–1.27)
GFR calc Af Amer: 81 mL/min/{1.73_m2} (ref >59)
GFR calc non Af Amer: 70 mL/min/{1.73_m2} (ref >59)
Glucose: 126 mg/dL — ABNORMAL HIGH (ref 65–99)
Potassium: 4.7 mmol/L (ref 3.5–5.2)
Sodium: 134 mmol/L (ref 134–144)

## 2019-08-27 LAB — VITAMIN D 25 HYDROXY (VIT D DEFICIENCY, FRACTURES): Vit D, 25-Hydroxy: 45.3 ng/mL (ref 30.0–100.0)

## 2019-08-27 LAB — MAGNESIUM: Magnesium: 1.9 mg/dL (ref 1.6–2.3)

## 2019-08-27 NOTE — Telephone Encounter (Signed)
-----   Message from Berniece Salines, DO sent at 08/27/2019 12:39 PM EDT ----- Labs normal, blood glucose improved compared to 2 weeks ago

## 2019-08-27 NOTE — Telephone Encounter (Signed)
The patient has been notified of the result and verbalized understanding.  All questions (if any) were answered. Wilma Flavin, RN 08/27/2019 12:58 PM

## 2019-09-06 ENCOUNTER — Telehealth: Payer: Self-pay | Admitting: Cardiology

## 2019-09-06 NOTE — Telephone Encounter (Signed)
Benjamin Boyd, wife of the patient called. The patient had some blood work done by his PA Dr. Alycia Patten at the [facility]. Those blood tests showed that the patient is Anemic and before any further testing can be done the [place] will ned approval to do testing from Dr. Harriet Masson. The patient will also need a referral to a GI Doctor sent to the [place]. The wife says that she gave the contact to someone from [place]  The patient had blood work done there so that he can get his medication covered by the [place].

## 2019-09-06 NOTE — Telephone Encounter (Signed)
We can refer the patient to [facility]. I am not sure what testing there are referring to. In order to approve a testing I would like to know what testing the doctor intend to have the patient do.   He is on Eliquis for PAF, he will need GI evaluation and possible endoscopy for his anemia.

## 2019-09-06 NOTE — Telephone Encounter (Signed)
Spoke with [place], the patients wife who states she was confused about getting clearance for a potential procedure that her husband may have. This RN clarified that the physicians office performing the procedure would fax over a clearance form and then Dr. Harriet Masson would advise from there. Offered to make referral to [place] patients wife states he has a GI doctor. I let her know to reach out to the patients current GI doctor in order to move forward with evaluating the patients anemia. She verbalized understanding and is aware to call back with any new questions.

## 2019-09-10 ENCOUNTER — Telehealth: Payer: Self-pay

## 2019-09-10 NOTE — Telephone Encounter (Signed)
° °  [facility] Pre-operative Risk Assessment    HEARTCARE STAFF: - Please ensure there is not already an duplicate clearance open for this procedure. - Under Visit Info/Reason for Call, type in Other and utilize the format Clearance MM/DD/YY or Clearance TBD. Do not use dashes or single digits. - If request is for dental extraction, please clarify the # of teeth to be extracted.  Request for surgical clearance:  1. What type of surgery is being performed? EGD, Colonoscopy   2. When is this surgery scheduled? TBD   3. What type of clearance is required (medical clearance vs. Pharmacy clearance to hold med vs. Both)? Medical  4. Are there any medications that need to be held prior to surgery and how long?Eliquis for 4 separate doses (2 days) prior to procedure.  Restart medication minimum 12 hours after procedure if ok with GI doctor per Dr. Godfrey Pick Tobb   5. Practice name and name of physician performing surgery? [facility]- Dr. Alycia Patten  6. What is the office phone number? [phone]   7.   What is the office fax number? [phone]  8.   Anesthesia type (None, local, MAC, general) ? N/A   Lowella Grip 09/10/2019, 12:32 PM  _________________________________________________________________   (provider comments below)

## 2019-09-10 NOTE — Telephone Encounter (Signed)
   Primary Cardiologist: Berniece Salines, DO  Chart reviewed as part of pre-operative protocol coverage. Given past medical history and time since last visit, based on ACC/AHA guidelines, Benjamin Boyd would be at acceptable risk for the planned procedure without further cardiovascular testing.   Patient with diagnosis of PAF on Eliquis for anticoagulation.    Procedure: EGD, Colonoscopy Date of procedure: TBD  CHADS2-VASc score of  4 ( HTN, AGE x 2, DM2)  CrCl 84 mL/min using adjusted body weight  Per office protocol, patient can hold Eliquis for 2 days prior to procedure.    I will route this recommendation to the requesting party via Epic fax function and remove from pre-op pool.  Please call with questions.  Jossie Ng. Arlee Santosuosso NP-C    09/10/2019, 4:02 PM [facility] [place] [address] Office [phone] Fax [phone]

## 2019-09-10 NOTE — Telephone Encounter (Signed)
Patient with diagnosis of PAF on Eliquis for anticoagulation.    Procedure: EGD, Colonoscopy Date of procedure: TBD  CHADS2-VASc score of  4 ( HTN, AGE x 2, DM2)  CrCl 84 mL/min using adjusted body weight  Per office protocol, patient can hold Eliquis for 2 days prior to procedure.

## 2019-10-19 DIAGNOSIS — J309 Allergic rhinitis, unspecified: Secondary | ICD-10-CM | POA: Diagnosis not present

## 2019-10-19 DIAGNOSIS — Z20828 Contact with and (suspected) exposure to other viral communicable diseases: Secondary | ICD-10-CM | POA: Diagnosis not present

## 2019-10-19 DIAGNOSIS — J329 Chronic sinusitis, unspecified: Secondary | ICD-10-CM | POA: Diagnosis not present

## 2019-10-19 DIAGNOSIS — J40 Bronchitis, not specified as acute or chronic: Secondary | ICD-10-CM | POA: Diagnosis not present

## 2019-11-30 DIAGNOSIS — I10 Essential (primary) hypertension: Secondary | ICD-10-CM | POA: Insufficient documentation

## 2019-11-30 DIAGNOSIS — I480 Paroxysmal atrial fibrillation: Secondary | ICD-10-CM | POA: Insufficient documentation

## 2019-11-30 DIAGNOSIS — E119 Type 2 diabetes mellitus without complications: Secondary | ICD-10-CM | POA: Insufficient documentation

## 2019-12-06 ENCOUNTER — Ambulatory Visit: Payer: PPO | Admitting: Cardiology

## 2020-04-03 DIAGNOSIS — E7800 Pure hypercholesterolemia, unspecified: Secondary | ICD-10-CM | POA: Diagnosis not present

## 2020-04-03 DIAGNOSIS — E1165 Type 2 diabetes mellitus with hyperglycemia: Secondary | ICD-10-CM | POA: Diagnosis not present

## 2020-04-03 DIAGNOSIS — J329 Chronic sinusitis, unspecified: Secondary | ICD-10-CM | POA: Diagnosis not present

## 2020-04-03 DIAGNOSIS — J40 Bronchitis, not specified as acute or chronic: Secondary | ICD-10-CM | POA: Diagnosis not present

## 2020-04-03 DIAGNOSIS — E1169 Type 2 diabetes mellitus with other specified complication: Secondary | ICD-10-CM | POA: Diagnosis not present

## 2020-04-11 DIAGNOSIS — Z6834 Body mass index (BMI) 34.0-34.9, adult: Secondary | ICD-10-CM | POA: Diagnosis not present

## 2020-04-11 DIAGNOSIS — R110 Nausea: Secondary | ICD-10-CM | POA: Diagnosis not present

## 2020-04-11 DIAGNOSIS — J40 Bronchitis, not specified as acute or chronic: Secondary | ICD-10-CM | POA: Diagnosis not present

## 2020-04-11 DIAGNOSIS — J329 Chronic sinusitis, unspecified: Secondary | ICD-10-CM | POA: Diagnosis not present

## 2020-04-25 DIAGNOSIS — L578 Other skin changes due to chronic exposure to nonionizing radiation: Secondary | ICD-10-CM | POA: Diagnosis not present

## 2020-04-25 DIAGNOSIS — L219 Seborrheic dermatitis, unspecified: Secondary | ICD-10-CM | POA: Diagnosis not present

## 2020-04-25 DIAGNOSIS — C44321 Squamous cell carcinoma of skin of nose: Secondary | ICD-10-CM | POA: Diagnosis not present

## 2020-05-01 DIAGNOSIS — C44311 Basal cell carcinoma of skin of nose: Secondary | ICD-10-CM | POA: Diagnosis not present

## 2020-08-03 DIAGNOSIS — C44311 Basal cell carcinoma of skin of nose: Secondary | ICD-10-CM | POA: Diagnosis not present

## 2020-08-03 DIAGNOSIS — L570 Actinic keratosis: Secondary | ICD-10-CM | POA: Diagnosis not present

## 2020-08-03 DIAGNOSIS — L219 Seborrheic dermatitis, unspecified: Secondary | ICD-10-CM | POA: Diagnosis not present

## 2020-08-24 DIAGNOSIS — K2270 Barrett's esophagus without dysplasia: Secondary | ICD-10-CM | POA: Diagnosis not present

## 2020-08-24 DIAGNOSIS — K219 Gastro-esophageal reflux disease without esophagitis: Secondary | ICD-10-CM | POA: Diagnosis not present

## 2020-09-19 DIAGNOSIS — K219 Gastro-esophageal reflux disease without esophagitis: Secondary | ICD-10-CM | POA: Diagnosis not present

## 2020-09-19 DIAGNOSIS — K2930 Chronic superficial gastritis without bleeding: Secondary | ICD-10-CM | POA: Diagnosis not present

## 2020-09-19 DIAGNOSIS — K2270 Barrett's esophagus without dysplasia: Secondary | ICD-10-CM | POA: Diagnosis not present

## 2020-09-19 DIAGNOSIS — Z8719 Personal history of other diseases of the digestive system: Secondary | ICD-10-CM | POA: Diagnosis not present

## 2020-09-19 DIAGNOSIS — I4891 Unspecified atrial fibrillation: Secondary | ICD-10-CM | POA: Diagnosis not present

## 2020-09-19 DIAGNOSIS — E119 Type 2 diabetes mellitus without complications: Secondary | ICD-10-CM | POA: Diagnosis not present

## 2020-09-19 DIAGNOSIS — Z7984 Long term (current) use of oral hypoglycemic drugs: Secondary | ICD-10-CM | POA: Diagnosis not present

## 2020-09-19 DIAGNOSIS — K3189 Other diseases of stomach and duodenum: Secondary | ICD-10-CM | POA: Diagnosis not present

## 2020-09-19 DIAGNOSIS — I10 Essential (primary) hypertension: Secondary | ICD-10-CM | POA: Diagnosis not present

## 2020-09-19 DIAGNOSIS — K2950 Unspecified chronic gastritis without bleeding: Secondary | ICD-10-CM | POA: Diagnosis not present

## 2020-09-19 DIAGNOSIS — Z7901 Long term (current) use of anticoagulants: Secondary | ICD-10-CM | POA: Diagnosis not present

## 2020-09-19 DIAGNOSIS — K229 Disease of esophagus, unspecified: Secondary | ICD-10-CM | POA: Diagnosis not present

## 2020-11-13 DIAGNOSIS — Z23 Encounter for immunization: Secondary | ICD-10-CM | POA: Diagnosis not present

## 2021-02-22 DIAGNOSIS — E7800 Pure hypercholesterolemia, unspecified: Secondary | ICD-10-CM | POA: Diagnosis not present

## 2021-02-22 DIAGNOSIS — J329 Chronic sinusitis, unspecified: Secondary | ICD-10-CM | POA: Diagnosis not present

## 2021-02-22 DIAGNOSIS — E1165 Type 2 diabetes mellitus with hyperglycemia: Secondary | ICD-10-CM | POA: Diagnosis not present

## 2021-02-22 DIAGNOSIS — J40 Bronchitis, not specified as acute or chronic: Secondary | ICD-10-CM | POA: Diagnosis not present

## 2021-02-22 DIAGNOSIS — E1169 Type 2 diabetes mellitus with other specified complication: Secondary | ICD-10-CM | POA: Diagnosis not present

## 2021-02-22 DIAGNOSIS — Z20828 Contact with and (suspected) exposure to other viral communicable diseases: Secondary | ICD-10-CM | POA: Diagnosis not present

## 2021-03-12 DIAGNOSIS — J209 Acute bronchitis, unspecified: Secondary | ICD-10-CM | POA: Diagnosis not present

## 2021-03-12 DIAGNOSIS — J301 Allergic rhinitis due to pollen: Secondary | ICD-10-CM | POA: Diagnosis not present

## 2021-04-30 DIAGNOSIS — L578 Other skin changes due to chronic exposure to nonionizing radiation: Secondary | ICD-10-CM | POA: Diagnosis not present

## 2021-04-30 DIAGNOSIS — L821 Other seborrheic keratosis: Secondary | ICD-10-CM | POA: Diagnosis not present

## 2022-03-29 DIAGNOSIS — J349 Unspecified disorder of nose and nasal sinuses: Secondary | ICD-10-CM | POA: Diagnosis not present

## 2022-03-29 DIAGNOSIS — R051 Acute cough: Secondary | ICD-10-CM | POA: Diagnosis not present

## 2022-03-29 DIAGNOSIS — J0100 Acute maxillary sinusitis, unspecified: Secondary | ICD-10-CM | POA: Diagnosis not present

## 2022-05-06 DIAGNOSIS — L570 Actinic keratosis: Secondary | ICD-10-CM | POA: Diagnosis not present

## 2022-05-06 DIAGNOSIS — L821 Other seborrheic keratosis: Secondary | ICD-10-CM | POA: Diagnosis not present

## 2022-05-06 DIAGNOSIS — L578 Other skin changes due to chronic exposure to nonionizing radiation: Secondary | ICD-10-CM | POA: Diagnosis not present

## 2022-05-06 DIAGNOSIS — L720 Epidermal cyst: Secondary | ICD-10-CM | POA: Diagnosis not present

## 2022-05-06 DIAGNOSIS — L219 Seborrheic dermatitis, unspecified: Secondary | ICD-10-CM | POA: Diagnosis not present

## 2022-05-06 DIAGNOSIS — D485 Neoplasm of uncertain behavior of skin: Secondary | ICD-10-CM | POA: Diagnosis not present

## 2022-07-10 DIAGNOSIS — L299 Pruritus, unspecified: Secondary | ICD-10-CM | POA: Diagnosis not present

## 2022-07-10 DIAGNOSIS — R0981 Nasal congestion: Secondary | ICD-10-CM | POA: Diagnosis not present

## 2022-07-10 DIAGNOSIS — R0982 Postnasal drip: Secondary | ICD-10-CM | POA: Diagnosis not present

## 2022-07-10 DIAGNOSIS — L270 Generalized skin eruption due to drugs and medicaments taken internally: Secondary | ICD-10-CM | POA: Diagnosis not present

## 2022-07-24 DIAGNOSIS — I10 Essential (primary) hypertension: Secondary | ICD-10-CM | POA: Diagnosis not present

## 2022-07-24 DIAGNOSIS — N390 Urinary tract infection, site not specified: Secondary | ICD-10-CM | POA: Diagnosis not present

## 2022-07-24 DIAGNOSIS — E118 Type 2 diabetes mellitus with unspecified complications: Secondary | ICD-10-CM | POA: Diagnosis not present

## 2022-07-24 DIAGNOSIS — N400 Enlarged prostate without lower urinary tract symptoms: Secondary | ICD-10-CM | POA: Diagnosis not present

## 2022-07-24 DIAGNOSIS — U071 COVID-19: Secondary | ICD-10-CM | POA: Diagnosis not present

## 2022-07-24 DIAGNOSIS — R32 Unspecified urinary incontinence: Secondary | ICD-10-CM | POA: Diagnosis not present

## 2022-07-24 DIAGNOSIS — H6123 Impacted cerumen, bilateral: Secondary | ICD-10-CM | POA: Diagnosis not present

## 2022-09-02 DIAGNOSIS — E871 Hypo-osmolality and hyponatremia: Secondary | ICD-10-CM | POA: Diagnosis not present

## 2022-09-02 DIAGNOSIS — R338 Other retention of urine: Secondary | ICD-10-CM | POA: Diagnosis not present

## 2022-09-02 DIAGNOSIS — M1990 Unspecified osteoarthritis, unspecified site: Secondary | ICD-10-CM | POA: Diagnosis not present

## 2022-09-02 DIAGNOSIS — R3914 Feeling of incomplete bladder emptying: Secondary | ICD-10-CM | POA: Diagnosis not present

## 2022-09-02 DIAGNOSIS — E8770 Fluid overload, unspecified: Secondary | ICD-10-CM | POA: Diagnosis not present

## 2022-09-02 DIAGNOSIS — N401 Enlarged prostate with lower urinary tract symptoms: Secondary | ICD-10-CM | POA: Diagnosis not present

## 2022-09-02 DIAGNOSIS — Z7984 Long term (current) use of oral hypoglycemic drugs: Secondary | ICD-10-CM | POA: Diagnosis not present

## 2022-09-02 DIAGNOSIS — R339 Retention of urine, unspecified: Secondary | ICD-10-CM | POA: Diagnosis not present

## 2022-09-02 DIAGNOSIS — E785 Hyperlipidemia, unspecified: Secondary | ICD-10-CM | POA: Diagnosis not present

## 2022-09-02 DIAGNOSIS — J189 Pneumonia, unspecified organism: Secondary | ICD-10-CM | POA: Diagnosis not present

## 2022-09-02 DIAGNOSIS — E119 Type 2 diabetes mellitus without complications: Secondary | ICD-10-CM | POA: Diagnosis not present

## 2022-09-02 DIAGNOSIS — Z8616 Personal history of COVID-19: Secondary | ICD-10-CM | POA: Diagnosis not present

## 2022-09-02 DIAGNOSIS — Z79899 Other long term (current) drug therapy: Secondary | ICD-10-CM | POA: Diagnosis not present

## 2022-09-02 DIAGNOSIS — Z792 Long term (current) use of antibiotics: Secondary | ICD-10-CM | POA: Diagnosis not present

## 2022-09-02 DIAGNOSIS — I10 Essential (primary) hypertension: Secondary | ICD-10-CM | POA: Diagnosis not present

## 2022-09-02 DIAGNOSIS — I4891 Unspecified atrial fibrillation: Secondary | ICD-10-CM | POA: Diagnosis not present

## 2022-09-02 DIAGNOSIS — I959 Hypotension, unspecified: Secondary | ICD-10-CM | POA: Diagnosis not present

## 2022-09-02 DIAGNOSIS — Z87891 Personal history of nicotine dependence: Secondary | ICD-10-CM | POA: Diagnosis not present

## 2022-09-02 DIAGNOSIS — G2581 Restless legs syndrome: Secondary | ICD-10-CM | POA: Diagnosis not present

## 2022-09-02 DIAGNOSIS — Z885 Allergy status to narcotic agent status: Secondary | ICD-10-CM | POA: Diagnosis not present

## 2022-09-02 DIAGNOSIS — Z7901 Long term (current) use of anticoagulants: Secondary | ICD-10-CM | POA: Diagnosis not present

## 2022-09-10 DIAGNOSIS — N401 Enlarged prostate with lower urinary tract symptoms: Secondary | ICD-10-CM | POA: Diagnosis not present

## 2022-09-10 DIAGNOSIS — E871 Hypo-osmolality and hyponatremia: Secondary | ICD-10-CM | POA: Diagnosis not present

## 2022-09-10 DIAGNOSIS — Z8639 Personal history of other endocrine, nutritional and metabolic disease: Secondary | ICD-10-CM | POA: Diagnosis not present

## 2022-09-10 DIAGNOSIS — R339 Retention of urine, unspecified: Secondary | ICD-10-CM | POA: Diagnosis not present

## 2022-09-10 DIAGNOSIS — Z79899 Other long term (current) drug therapy: Secondary | ICD-10-CM | POA: Diagnosis not present

## 2022-09-25 DIAGNOSIS — R339 Retention of urine, unspecified: Secondary | ICD-10-CM | POA: Diagnosis not present

## 2022-09-25 DIAGNOSIS — N401 Enlarged prostate with lower urinary tract symptoms: Secondary | ICD-10-CM | POA: Diagnosis not present

## 2022-12-10 DIAGNOSIS — M5431 Sciatica, right side: Secondary | ICD-10-CM | POA: Diagnosis not present

## 2023-05-05 DIAGNOSIS — L578 Other skin changes due to chronic exposure to nonionizing radiation: Secondary | ICD-10-CM | POA: Diagnosis not present

## 2023-05-05 DIAGNOSIS — L300 Nummular dermatitis: Secondary | ICD-10-CM | POA: Diagnosis not present

## 2023-05-05 DIAGNOSIS — L820 Inflamed seborrheic keratosis: Secondary | ICD-10-CM | POA: Diagnosis not present

## 2023-05-05 DIAGNOSIS — L720 Epidermal cyst: Secondary | ICD-10-CM | POA: Diagnosis not present

## 2023-05-05 DIAGNOSIS — L821 Other seborrheic keratosis: Secondary | ICD-10-CM | POA: Diagnosis not present

## 2023-05-29 DIAGNOSIS — R0981 Nasal congestion: Secondary | ICD-10-CM | POA: Diagnosis not present

## 2023-05-29 DIAGNOSIS — R051 Acute cough: Secondary | ICD-10-CM | POA: Diagnosis not present

## 2024-01-22 DIAGNOSIS — R051 Acute cough: Secondary | ICD-10-CM | POA: Diagnosis not present

## 2024-01-22 DIAGNOSIS — R0981 Nasal congestion: Secondary | ICD-10-CM | POA: Diagnosis not present
